# Patient Record
Sex: Female | Born: 1985 | Race: Black or African American | Hispanic: No | Marital: Single | Smoking: Never smoker
Health system: Southern US, Community
[De-identification: ages and names within clinical notes are randomized; demographics above are authoritative.]

## PROBLEM LIST (undated history)

## (undated) DIAGNOSIS — J45909 Unspecified asthma, uncomplicated: Secondary | ICD-10-CM

## (undated) DIAGNOSIS — G43909 Migraine, unspecified, not intractable, without status migrainosus: Secondary | ICD-10-CM

---

## 2009-09-13 ENCOUNTER — Emergency Department (HOSPITAL_COMMUNITY): Admission: EM | Admit: 2009-09-13 | Discharge: 2009-09-14 | Payer: Self-pay | Admitting: Emergency Medicine

## 2010-04-09 ENCOUNTER — Emergency Department (HOSPITAL_COMMUNITY): Admission: EM | Admit: 2010-04-09 | Discharge: 2010-04-09 | Payer: Self-pay | Admitting: Emergency Medicine

## 2010-10-05 ENCOUNTER — Emergency Department (HOSPITAL_COMMUNITY): Payer: Self-pay

## 2010-10-05 ENCOUNTER — Emergency Department (HOSPITAL_COMMUNITY)
Admission: EM | Admit: 2010-10-05 | Discharge: 2010-10-06 | Disposition: A | Discharge status: Home / Self Care | Payer: Self-pay | Attending: Emergency Medicine | Admitting: Emergency Medicine

## 2010-10-05 DIAGNOSIS — K5900 Constipation, unspecified: Secondary | ICD-10-CM | POA: Insufficient documentation

## 2010-10-05 DIAGNOSIS — R112 Nausea with vomiting, unspecified: Secondary | ICD-10-CM | POA: Insufficient documentation

## 2013-01-04 ENCOUNTER — Emergency Department (HOSPITAL_COMMUNITY)
Admission: EM | Admit: 2013-01-04 | Discharge: 2013-01-05 | Disposition: A | Discharge status: Home / Self Care | Payer: Self-pay | Attending: Emergency Medicine | Admitting: Emergency Medicine

## 2013-01-04 ENCOUNTER — Encounter (HOSPITAL_COMMUNITY): Payer: Self-pay | Admitting: Cardiology

## 2013-01-04 ENCOUNTER — Emergency Department (HOSPITAL_COMMUNITY): Payer: Self-pay

## 2013-01-04 DIAGNOSIS — R110 Nausea: Secondary | ICD-10-CM | POA: Insufficient documentation

## 2013-01-04 DIAGNOSIS — F419 Anxiety disorder, unspecified: Secondary | ICD-10-CM

## 2013-01-04 DIAGNOSIS — H53149 Visual discomfort, unspecified: Secondary | ICD-10-CM | POA: Insufficient documentation

## 2013-01-04 DIAGNOSIS — R200 Anesthesia of skin: Secondary | ICD-10-CM

## 2013-01-04 DIAGNOSIS — R42 Dizziness and giddiness: Secondary | ICD-10-CM | POA: Insufficient documentation

## 2013-01-04 DIAGNOSIS — Z79899 Other long term (current) drug therapy: Secondary | ICD-10-CM | POA: Insufficient documentation

## 2013-01-04 DIAGNOSIS — R51 Headache: Secondary | ICD-10-CM | POA: Insufficient documentation

## 2013-01-04 DIAGNOSIS — J45909 Unspecified asthma, uncomplicated: Secondary | ICD-10-CM | POA: Insufficient documentation

## 2013-01-04 DIAGNOSIS — R5381 Other malaise: Secondary | ICD-10-CM | POA: Insufficient documentation

## 2013-01-04 DIAGNOSIS — Z792 Long term (current) use of antibiotics: Secondary | ICD-10-CM | POA: Insufficient documentation

## 2013-01-04 DIAGNOSIS — R209 Unspecified disturbances of skin sensation: Secondary | ICD-10-CM | POA: Insufficient documentation

## 2013-01-04 HISTORY — DX: Unspecified asthma, uncomplicated: J45.909

## 2013-01-04 MED ORDER — METOCLOPRAMIDE HCL 5 MG/ML IJ SOLN
10.0000 mg | Freq: Once | INTRAMUSCULAR | Status: AC
  Administered 2013-01-04: 10 mg via INTRAVENOUS
  Filled 2013-01-04: qty 2

## 2013-01-04 MED ORDER — ONDANSETRON HCL 4 MG/2ML IJ SOLN
4.0000 mg | Freq: Once | INTRAMUSCULAR | Status: AC
  Administered 2013-01-04: 4 mg via INTRAVENOUS
  Filled 2013-01-04: qty 2

## 2013-01-04 MED ORDER — DIPHENHYDRAMINE HCL 50 MG/ML IJ SOLN
25.0000 mg | Freq: Once | INTRAMUSCULAR | Status: AC
  Administered 2013-01-04: 25 mg via INTRAVENOUS
  Filled 2013-01-04: qty 1

## 2013-01-04 MED ORDER — DEXAMETHASONE SODIUM PHOSPHATE 10 MG/ML IJ SOLN
10.0000 mg | Freq: Once | INTRAMUSCULAR | Status: AC
  Administered 2013-01-04: 10 mg via INTRAVENOUS
  Filled 2013-01-04: qty 1

## 2013-01-04 MED ORDER — SODIUM CHLORIDE 0.9 % IV BOLUS (SEPSIS)
1000.0000 mL | Freq: Once | INTRAVENOUS | Status: AC
  Administered 2013-01-04: 1000 mL via INTRAVENOUS

## 2013-01-04 MED ORDER — KETOROLAC TROMETHAMINE 30 MG/ML IJ SOLN
30.0000 mg | Freq: Once | INTRAMUSCULAR | Status: AC
  Administered 2013-01-04: 30 mg via INTRAVENOUS
  Filled 2013-01-04: qty 1

## 2013-01-04 NOTE — ED Provider Notes (Signed)
CSN: [id number]     Arrival date & time 01/04/13  1636 History     First MD Initiated Contact with Patient 01/04/13 2107     Chief Complaint  Patient presents with  . Headache  . Dizziness  . Numbness   (Consider location/radiation/quality/duration/timing/severity/associated sxs/prior Treatment) Patient is a 27 y.o. female presenting with headaches. The history is provided by the patient and a relative.  Headache Pain location:  Frontal Quality:  Dull Severity currently:  8/10 Severity at highest:  8/10 Onset quality:  Gradual Duration:  1 week Timing:  Intermittent Progression:  Unchanged Chronicity:  Recurrent Similar to prior headaches: yes   Relieved by:  Nothing Worsened by:  Light and sound Ineffective treatments:  NSAIDs Associated symptoms: nausea, numbness and photophobia   Associated symptoms: no abdominal pain, no back pain, no congestion, no cough, no diarrhea, no dizziness, no ear pain, no fatigue, no fever, no neck pain, no neck stiffness, no seizures, no sore throat and no vomiting     Past Medical History  Diagnosis Date  . Asthma    History reviewed. No pertinent past surgical history. History reviewed. No pertinent family history. History  Substance Use Topics  . Smoking status: Never Smoker   . Smokeless tobacco: Not on file  . Alcohol Use: Yes   OB History   Grav Para Term Preterm Abortions TAB SAB Ect Mult Living                 Review of Systems  Constitutional: Negative for fever, chills, diaphoresis and fatigue.  HENT: Negative for ear pain, congestion, sore throat, facial swelling, mouth sores, trouble swallowing, neck pain and neck stiffness.   Eyes: Positive for photophobia.  Respiratory: Negative for apnea, cough, chest tightness, shortness of breath and wheezing.   Cardiovascular: Negative for chest pain, palpitations and leg swelling.  Gastrointestinal: Positive for nausea. Negative for vomiting, abdominal pain, diarrhea and  abdominal distention.  Genitourinary: Negative for hematuria, flank pain, vaginal discharge, difficulty urinating and menstrual problem.  Musculoskeletal: Negative for back pain and gait problem.  Skin: Negative for rash and wound.  Neurological: Positive for speech difficulty, weakness, numbness and headaches. Negative for dizziness, tremors, seizures, syncope and facial asymmetry.  Psychiatric/Behavioral: Negative.   All other systems reviewed and are negative.    Allergies  Gluten meal  Home Medications   Current Outpatient Rx  Name  Route  Sig  Dispense  Refill  . albuterol (PROVENTIL HFA;VENTOLIN HFA) 108 (90 BASE) MCG/ACT inhaler   Inhalation   Inhale 2 puffs into the lungs every 6 (six) hours as needed for wheezing.         Marland Kitchen albuterol (PROVENTIL) (2.5 MG/3ML) 0.083% nebulizer solution   Nebulization   Take 2.5 mg by nebulization at bedtime as needed for wheezing.         Marland Kitchen ECHINACEA EXTRACT PO   Oral   Take 2 drops by mouth 3 (three) times daily. For immune support         . ECHINACEA PO   Oral   Take 1 tablet by mouth at bedtime. For immune support         . ibuprofen (ADVIL,MOTRIN) 800 MG tablet   Oral   Take 800 mg by mouth every 8 (eight) hours as needed for pain.         . Magnesium Hydroxide (MAGNESIA PO)   Oral   Take 1 tablet by mouth daily.         Marland Kitchen  Multiple Vitamin (MULTIVITAMIN WITH MINERALS) TABS tablet   Oral   Take 1 tablet by mouth daily.         . traMADol (ULTRAM) 50 MG tablet   Oral   Take 50 mg by mouth every 6 (six) hours as needed for pain.         Marland Kitchen azithromycin (ZITHROMAX) 250 MG tablet   Oral   Take 250-500 mg by mouth daily.          BP 128/78  Pulse 87  Temp(Src) 98 F (36.7 C) (Oral)  Resp 18  SpO2 100%  LMP 12/28/2012 Physical Exam  Nursing note and vitals reviewed. Constitutional: She is oriented to person, place, and time. She appears well-developed and well-nourished. No distress.  HENT:  Head:  Normocephalic and atraumatic.  Right Ear: External ear normal.  Left Ear: External ear normal.  Nose: Nose normal.  Mouth/Throat: Oropharynx is clear and moist. No oropharyngeal exudate.  Eyes: Conjunctivae and EOM are normal. Pupils are equal, round, and reactive to light. Right eye exhibits no discharge. Left eye exhibits no discharge.  Neck: Normal range of motion. Neck supple. No JVD present. No tracheal deviation present. No thyromegaly present.  Cardiovascular: Normal rate, regular rhythm, normal heart sounds and intact distal pulses.  Exam reveals no gallop and no friction rub.   No murmur heard. Pulmonary/Chest: Effort normal and breath sounds normal. No respiratory distress. She has no wheezes. She has no rales. She exhibits no tenderness.  Abdominal: Soft. Bowel sounds are normal. She exhibits no distension. There is no tenderness. There is no rebound and no guarding.  Musculoskeletal: Normal range of motion.  Lymphadenopathy:    She has no cervical adenopathy.  Neurological: She is alert and oriented to person, place, and time. No cranial nerve deficit. Coordination normal. GCS eye subscore is 4. GCS verbal subscore is 5. GCS motor subscore is 6.  Patient states that she cannot feel anything on the right side of her body including her face. Also patient says she is weak in her right arm. When i hold arm above face it does not drop and hit her. Also when I move her arm she resists me with full force. When i apply painful stimulus she withdrawals in her arm and leg  Skin: Skin is warm. No rash noted. She is not diaphoretic.  Psychiatric: She has a normal mood and affect. Her behavior is normal. Judgment and thought content normal. Her speech is delayed.    ED Course   Procedures (including critical care time)  Labs Reviewed - No data to display Ct Head Wo Contrast  01/04/2013   *RADIOLOGY REPORT*  Clinical Data: Headache, dizziness, numbness  CT HEAD WITHOUT CONTRAST  Technique:   Contiguous axial images were obtained from the base of the skull through the vertex without contrast.  Comparison:  None available  Findings: There is no acute intracranial hemorrhage or infarct.  No midline shift or mass lesion.  No extra-axial fluid collection. CSF containing spaces are normal.  Calvarium is intact.  Minimal opacity is present within the right maxillary sinus.  Otherwise, paranasal sinuses and mastoid air cells are clear.  IMPRESSION:  Normal head CT.  No acute intracranial process.   Original Report Authenticated By: Rise Mu, M.D.   1. Numbness   2. Headache   3. Anxiety     MDM  27 yr old F pt with no PMH here with HA and complaints of weakness and numbness. When I walked  into the room patient seemed to talk very slowly but was no aphasic and could repeat. Family says this just started as I walked into the room. Patient says she has had a HA for the past week which is worse with light and sound. She has been under a lot of stress lately and says these kinds of things happen when she feels more stress and her family corroborates that out of the room. Patient with inconsistent neuro exam detailed above. She says she cannot feel anything on the right side of her body but withdrawals to painful stimulus. Patient will also keep her arm from hitting her face and will resist motion in her arm. i believe she has a migraine. Will Ct to screen for any acute abnormality and reassess after headache is better controlled.   Patient with normal CT. She has been ambulatory in the ED. The numbness does not seem to be from a central process with inconsistent exam and feeling on exam when elicited with more painful stimuli. Will give info for outpatient neurology if symptoms persist.  Case discussed with Dr. Elvin So, MD 01/05/13 [phone]

## 2013-01-04 NOTE — ED Notes (Signed)
Friend Contact Information  Algie Coffer: [phone]

## 2013-01-04 NOTE — ED Notes (Signed)
Pt reports that for the past 4 days she has had a headache, numbness in her right arm and dizziness. States that she has also had dry mouth. Denies any vision changes. No neuro deficits noted.

## 2013-01-04 NOTE — ED Notes (Signed)
Pt in route in CT at this time.

## 2013-01-04 NOTE — ED Notes (Signed)
Pt reports migraine headache. Resident Lew Dawes made aware of neuro changes.

## 2013-01-05 NOTE — ED Notes (Signed)
Pt ambulated without assistance

## 2013-01-08 ENCOUNTER — Encounter (HOSPITAL_COMMUNITY): Payer: Self-pay | Admitting: Emergency Medicine

## 2013-01-08 ENCOUNTER — Emergency Department (HOSPITAL_COMMUNITY)
Admission: EM | Admit: 2013-01-08 | Discharge: 2013-01-08 | Disposition: A | Discharge status: Home / Self Care | Payer: Self-pay | Attending: Emergency Medicine | Admitting: Emergency Medicine

## 2013-01-08 DIAGNOSIS — J45909 Unspecified asthma, uncomplicated: Secondary | ICD-10-CM | POA: Insufficient documentation

## 2013-01-08 DIAGNOSIS — Q674 Other congenital deformities of skull, face and jaw: Secondary | ICD-10-CM | POA: Insufficient documentation

## 2013-01-08 DIAGNOSIS — R51 Headache: Secondary | ICD-10-CM | POA: Insufficient documentation

## 2013-01-08 DIAGNOSIS — Z792 Long term (current) use of antibiotics: Secondary | ICD-10-CM | POA: Insufficient documentation

## 2013-01-08 DIAGNOSIS — R300 Dysuria: Secondary | ICD-10-CM | POA: Insufficient documentation

## 2013-01-08 DIAGNOSIS — Z79899 Other long term (current) drug therapy: Secondary | ICD-10-CM | POA: Insufficient documentation

## 2013-01-08 DIAGNOSIS — R200 Anesthesia of skin: Secondary | ICD-10-CM

## 2013-01-08 DIAGNOSIS — Z3202 Encounter for pregnancy test, result negative: Secondary | ICD-10-CM | POA: Insufficient documentation

## 2013-01-08 DIAGNOSIS — R519 Headache, unspecified: Secondary | ICD-10-CM

## 2013-01-08 DIAGNOSIS — R4789 Other speech disturbances: Secondary | ICD-10-CM | POA: Insufficient documentation

## 2013-01-08 DIAGNOSIS — R209 Unspecified disturbances of skin sensation: Secondary | ICD-10-CM | POA: Insufficient documentation

## 2013-01-08 LAB — CBC WITH DIFFERENTIAL/PLATELET
Basophils Relative: 0 % (ref 0–1)
Eosinophils Relative: 1 % (ref 0–5)
Hemoglobin: 13.3 g/dL (ref 12.0–15.0)
Lymphocytes Relative: 19 % (ref 12–46)
Lymphs Abs: 1.6 10*3/uL (ref 0.7–4.0)
MCV: 88.1 fL (ref 78.0–100.0)
Monocytes Absolute: 0.5 10*3/uL (ref 0.1–1.0)
Monocytes Relative: 7 % (ref 3–12)
Platelets: 314 10*3/uL (ref 150–400)
RBC: 4.54 MIL/uL (ref 3.87–5.11)
RDW: 13 % (ref 11.5–15.5)

## 2013-01-08 LAB — BASIC METABOLIC PANEL
CO2: 29 mEq/L (ref 19–32)
Creatinine, Ser: 0.97 mg/dL (ref 0.50–1.10)
GFR calc Af Amer: >90 mL/min (ref >90)
GFR calc non Af Amer: 81 mL/min — ABNORMAL LOW (ref >90)
Glucose, Bld: 93 mg/dL (ref 70–99)
Potassium: 3.7 mEq/L (ref 3.5–5.1)

## 2013-01-08 LAB — URINALYSIS, ROUTINE W REFLEX MICROSCOPIC
Bilirubin Urine: NEGATIVE
Hgb urine dipstick: NEGATIVE
Ketones, ur: NEGATIVE mg/dL
Specific Gravity, Urine: 1.008 (ref 1.005–1.030)

## 2013-01-08 MED ORDER — BUTALBITAL-APAP-CAFFEINE 50-325-40 MG PO TABS: 1.0000 | ORAL_TABLET | Freq: Four times a day (QID) | ORAL | Status: DC | PRN

## 2013-01-08 MED ORDER — DIPHENHYDRAMINE HCL 25 MG PO CAPS
25.0000 mg | ORAL_CAPSULE | Freq: Once | ORAL | Status: AC
  Administered 2013-01-08: 25 mg via ORAL
  Filled 2013-01-08: qty 1

## 2013-01-08 MED ORDER — SODIUM CHLORIDE 0.9 % IV BOLUS (SEPSIS)
1000.0000 mL | Freq: Once | INTRAVENOUS | Status: AC
  Administered 2013-01-08: 1000 mL via INTRAVENOUS

## 2013-01-08 MED ORDER — PROCHLORPERAZINE MALEATE 5 MG PO TABS
5.0000 mg | ORAL_TABLET | Freq: Once | ORAL | Status: AC
  Administered 2013-01-08: 5 mg via ORAL
  Filled 2013-01-08: qty 1

## 2013-01-08 NOTE — ED Notes (Addendum)
Pt has R side weakness. Pt walked to room from lobby independently with not difficulty.

## 2013-01-08 NOTE — ED Provider Notes (Signed)
CSN: [id number]     Arrival date & time 01/08/13  1019 History     First MD Initiated Contact with Patient 01/08/13 1031     Chief Complaint  Patient presents with  . Headache  . Dysuria   (Consider location/radiation/quality/duration/timing/severity/associated sxs/prior Treatment) HPI  Tami Cruz is a 27 y.o. female complaining of increasing diffuse headache described as throbbing, rated at 8/10, cannot identify any exacerbating or alleviating factors. Patient also states that she has developed slurred speech and she has right-sided upper extremity weakness which as per the patient is new however as per chart review patient had had this when she was evaluated at [facility] 4 days ago. There is a numbness and tingling in the right arm that she states radiates into the lower back. She is also complaining that she cannot urinate or defecate however she states that she urinated to yesterday afternoon last bowel movement was 3 days ago. She had one episode of nonbloody, nonbilious, no coffee ground appearing emesis this a.m. Patient has been taking tramadol and Motrin at home with no relief. Patient denies fever, cervicalgia, chest pain, shortness of breath, abdominal pain   Past Medical History  Diagnosis Date  . Asthma    History reviewed. No pertinent past surgical history. History reviewed. No pertinent family history. History  Substance Use Topics  . Smoking status: Never Smoker   . Smokeless tobacco: Not on file  . Alcohol Use: Yes   OB History   Grav Para Term Preterm Abortions TAB SAB Ect Mult Living                 Review of Systems 10 systems reviewed and found to be negative, except as noted in the HPI   Allergies  Gluten meal  Home Medications   Current Outpatient Rx  Name  Route  Sig  Dispense  Refill  . albuterol (PROVENTIL HFA;VENTOLIN HFA) 108 (90 BASE) MCG/ACT inhaler   Inhalation   Inhale 2 puffs into the lungs every 6 (six) hours as needed for  wheezing.         Marland Kitchen albuterol (PROVENTIL) (2.5 MG/3ML) 0.083% nebulizer solution   Nebulization   Take 2.5 mg by nebulization at bedtime as needed for wheezing.         Marland Kitchen ECHINACEA EXTRACT PO   Oral   Take 2 drops by mouth 2 (two) times daily. For immune support         . ibuprofen (ADVIL,MOTRIN) 800 MG tablet   Oral   Take 800 mg by mouth every 8 (eight) hours as needed for pain.         . Magnesium Hydroxide (MAGNESIA PO)   Oral   Take 1 tablet by mouth daily.         . Multiple Vitamin (MULTIVITAMIN WITH MINERALS) TABS tablet   Oral   Take 1 tablet by mouth daily.         . traMADol (ULTRAM) 50 MG tablet   Oral   Take 50 mg by mouth every 6 (six) hours as needed for pain.         Marland Kitchen azithromycin (ZITHROMAX) 250 MG tablet   Oral   Take 250-500 mg by mouth daily.          BP 140/89  Pulse 85  Temp(Src) 99 F (37.2 C) (Oral)  Resp 16  SpO2 99%  LMP 12/28/2012 Physical Exam  Nursing note and vitals reviewed. Constitutional: She is oriented to person, place,  and time. She appears well-developed and well-nourished. No distress.  HENT:  Head: Normocephalic and atraumatic.  Mouth/Throat: Oropharynx is clear and moist.  Facial asymmetry, no slurred speech. Patient does have a slowed and stuttering speech  Eyes: Conjunctivae and EOM are normal. Pupils are equal, round, and reactive to light.  Neck: Normal range of motion. Neck supple.  Cardiovascular: Normal rate, regular rhythm and intact distal pulses.   Pulmonary/Chest: Effort normal and breath sounds normal. No stridor. No respiratory distress. She has no wheezes. She has no rales. She exhibits no tenderness.  Abdominal: Soft. Bowel sounds are normal. She exhibits no distension and no mass. There is no tenderness. There is no rebound and no guarding.  Musculoskeletal: Normal range of motion.  Neurological: She is alert and oriented to person, place, and time. She displays normal reflexes.  Inconsistent  neurologic exam with right-sided weakness when patient is not distracted. Holding the arm above the face and dropping it she gently lowers it to the side of her face, however when evaluating grip strength right side is significantly weaker than left. However patient is able to ambulate independently without issue.       ED Course   Procedures (including critical care time)  Labs Reviewed  BASIC METABOLIC PANEL - Abnormal; Notable for the following:    GFR calc non Af Amer 81 (*)    All other components within normal limits  URINALYSIS, ROUTINE W REFLEX MICROSCOPIC  PREGNANCY, URINE  CBC WITH DIFFERENTIAL   No results found. 1. Headache   2. Numbness     MDM   Filed Vitals:   01/08/13 1025 01/08/13 1311  BP: 140/89 130/78  Pulse: 85 75  Temp: 99 F (37.2 C)   TempSrc: Oral   Resp: 16 18  SpO2: 99% 98%     Tami Cruz is a 27 y.o. female with headache and complaining of right-sided upper extremity weakness, physical exam does not show consistent weakness to the right upper extremity, patient does not have any focal abnormality there is identified on physical exam with repeat testing. Patient was seen for and evaluated for similar complaints 4 days ago. She says she has an appointment with a neurologist at [facility]. I do not think there are any acute neurologic processes that need interventions at this time. Discussed case with attending who agrees with plan and stability to d/c to home.   Medications  sodium chloride 0.9 % bolus 1,000 mL (0 mLs Intravenous Stopped 01/08/13 1219)  diphenhydrAMINE (BENADRYL) capsule 25 mg (25 mg Oral Given 01/08/13 1319)  prochlorperazine (COMPAZINE) tablet 5 mg (5 mg Oral Given 01/08/13 1319)    Pt is hemodynamically stable, appropriate for, and amenable to discharge at this time. Pt verbalized understanding and agrees with care plan. All questions answered. Outpatient follow-up and specific return precautions discussed.    Discharge  Medication List as of 01/08/2013 12:41 PM    START taking these medications   Details  butalbital-acetaminophen-caffeine (FIORICET) 50-325-40 MG per tablet Take 1 tablet by mouth every 6 (six) hours as needed for headache., Starting 01/08/2013, Until Tue 01/08/14, Print        Note: Portions of this report may have been transcribed using voice recognition software. Every effort was made to ensure accuracy; however, inadvertent computerized transcription errors may be present    Wynetta Emery, PA-C 01/08/13 1546

## 2013-01-08 NOTE — ED Provider Notes (Signed)
Medical screening examination/treatment/procedure(s) were performed by non-physician practitioner and as supervising physician I was immediately available for consultation/collaboration.   Myron Stankovich, MD 01/08/13 2155 

## 2013-01-08 NOTE — ED Notes (Signed)
Pt reports gradual increase in HA since seen at [facility] 4 days ago. Pt states she has developed slurred speech.  Pt states she has to concentrate more to go to the bathroom which is unusual for her. Pt reports numbness and tingling in R arm that radiates to lower back. Pt denies sudden onset of symptoms. Denies head injury.

## 2013-01-08 NOTE — Progress Notes (Signed)
[facility] provided patient with a list of primary care resources. Patient stated that she is pending medicaid.

## 2013-01-08 NOTE — ED Notes (Signed)
Waiting for medications from pharmacy

## 2013-01-09 NOTE — ED Provider Notes (Signed)
I performed a history and physical examination of  Tami Cruz and discussed her management with Dr. Lew Dawes. I agree with the history, physical, assessment, and plan of care, with the following exceptions: None I was present for the following procedures: None  Time Spent in Critical Care of the patient: None  Time spent in discussions with the patient and family: 15 minutes  Arafat Cocuzza  Pt with headaches, no nausea, vomiting, visual complains, seizures, altered mental status, loss of consciousness, no gait instability (per patient and Dr. Lew Dawes). Also c/o weakness and numbness. Speech is slow, but able to articulate everything well, and no dysarthria. Pt has had similar episodes with increased stress in the past, just not to this extent. Today had an argument with the family member. 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 We observed in the ED, and gave her Neuro f/u if she is not to get better. No concerns for brain bleed, aneurysms, infection stroke.   Derwood Kaplan, MD 01/09/13 1640

## 2013-01-10 ENCOUNTER — Telehealth: Payer: Self-pay | Admitting: Neurology

## 2013-01-10 ENCOUNTER — Ambulatory Visit: Payer: Self-pay | Admitting: Emergency Medicine

## 2013-01-10 VITALS — BP 130/78 | HR 83 | Temp 98.0°F | Resp 16 | Ht 64.0 in | Wt 233.0 lb

## 2013-01-10 DIAGNOSIS — R51 Headache: Secondary | ICD-10-CM

## 2013-01-10 DIAGNOSIS — K5900 Constipation, unspecified: Secondary | ICD-10-CM

## 2013-01-10 MED ORDER — BISACODYL 5 MG PO TBEC: 5.0000 mg | DELAYED_RELEASE_TABLET | Freq: Every day | ORAL | Status: DC | PRN

## 2013-01-10 NOTE — Patient Instructions (Addendum)

## 2013-01-10 NOTE — Telephone Encounter (Signed)
Mom has repeatedly called the office for an appt and has been repeatedly instructed to contact the patient's PCP for a work up with them first. I spoke w/ mom today, mom states that her daughter called for an appt on Friday and was told by our office to drink water and call back on Monday. I advised mom that our office does not instruct patients to drink water and call back later, our first encounter with the patient was via telephone on Monday. This is documented on a referral form. The patient called in on Monday as an ER referral. The request was sent back to Dr. Arbutus Leas for review on Monday 01/08/13. Dr. Arbutus Leas reviewed the patient's ER notes and instructed our office staff to have the patient follow up with her PCP. I called Monday and spoke with the patient. The patient was advised to follow up with her PCP and verbalized understanding. The patient's mother called 01/09/13 and spoke with Arman Bogus. Jan was initially unaware that we had already spoken with the patient. Jan spoke w/ mom, advised her that she would check into the status of the referral and call her back. After speaking with the mom, Arman Bogus followed up on the referral and was advised that I spoke with the patient the day prior and advised the patient to follow up with her primary care. Jan tried to call the patient's mother back within 5 minutes but got no answer. She left a VM asking mom to call our office back. Jan also contacted to patient and asked her to let her mother know that we did try to follow back up with her and that we have already spoke with her (the patient) and instructed her to follow up with her PCP. The pt agreed to do this. The patient's mother called again today. Again stating no one has called her back. Our office called the pt 01/08/13 and 01/09/13 and spoke directly with the patient on both dates. We also returned the call to mom on 01/09/13 as requested and got no answer. A VM was left for mom to call. Mom was very angry and  frustrated. The call continued to escalate (with regards to mom's behavior), the call was handed off from Arman Bogus to me to handle. Mom was difficult to communicate with. She is clearly frustrated and angry b/c we will not see her daughter. She insisted that our office was saying the 3 ER visits her daughter had gone to her wrong, the hospitals and providers at these hospitals were incompetent and the testing results were wrong. She repeatedly requested that we confirm that this is what we are telling her. I advised mom that this is NOT what our office is stating at all. We are only stating that our medical director, Dr. Arbutus Leas, has reviewed the ER records available in [facility] and determined the referral does not meet our office criteria for an appt. Dr. Arbutus Leas recommended that the pt see her PCP for an appt and proceed from there. Mom asked what this decision was based on. Again mom was advised that this decision is based on Dr. Don Perking medical opinion as our medical director. This pt does not meet our office criteria for an appt. It may be that the patient needs additional work up or testing before a neurology consult warranted. Mom and I were unable to resolve the issue and mom states she will be writing a letter regarding this issue. I will be glad to speak with the  pt or mom if she has additional questions or concerns. / Sherri S.

## 2013-01-10 NOTE — Progress Notes (Signed)
[facility] [address] [place] [address] [phone]  Date:  01/10/2013   Name:  Tami Cruz   DOB:  11-03-1985   MRN:  [id number]  PCP:  No PCP Per Patient    Chief Complaint: Migraine and Constipation   History of Present Illness:  Tami Cruz is a 27 y.o. very pleasant female patient who presents with the following:  History of three visits to the ER for headaches and discharged each time with medications.  Had a negative CT head.  Here to receive a referral to neurology for further evaluation.  Says has had no improvement in her condition with the medication provided in the ER and says it is now an "8".  No neuro or visual symptoms. No antecedent illness or injury.  No fever or chills.  No improvement with over the counter medications or other home remedies.    There are no active problems to display for this patient.   Past Medical History  Diagnosis Date  . Asthma     No past surgical history on file.  History  Substance Use Topics  . Smoking status: Never Smoker   . Smokeless tobacco: Not on file  . Alcohol Use: Yes    History reviewed. No pertinent family history.  Allergies  Allergen Reactions  . Gluten Meal Nausea Only and Other (See Comments)    GI upset and discomfort    Medication list has been reviewed and updated.  Current Outpatient Prescriptions on File Prior to Visit  Medication Sig Dispense Refill  . albuterol (PROVENTIL HFA;VENTOLIN HFA) 108 (90 BASE) MCG/ACT inhaler Inhale 2 puffs into the lungs every 6 (six) hours as needed for wheezing.      Marland Kitchen albuterol (PROVENTIL) (2.5 MG/3ML) 0.083% nebulizer solution Take 2.5 mg by nebulization at bedtime as needed for wheezing.      Marland Kitchen ECHINACEA EXTRACT PO Take 2 drops by mouth 2 (two) times daily. For immune support      . ibuprofen (ADVIL,MOTRIN) 800 MG tablet Take 800 mg by mouth every 8 (eight) hours as needed for pain.      . Magnesium Hydroxide  (MAGNESIA PO) Take 1 tablet by mouth daily.      . Multiple Vitamin (MULTIVITAMIN WITH MINERALS) TABS tablet Take 1 tablet by mouth daily.      Marland Kitchen azithromycin (ZITHROMAX) 250 MG tablet Take 250-500 mg by mouth daily.      . butalbital-acetaminophen-caffeine (FIORICET) 50-325-40 MG per tablet Take 1 tablet by mouth every 6 (six) hours as needed for headache.  20 tablet  0  . traMADol (ULTRAM) 50 MG tablet Take 50 mg by mouth every 6 (six) hours as needed for pain.       No current facility-administered medications on file prior to visit.    Review of Systems:  As per HPI, otherwise negative.    Physical Examination: Filed Vitals:   01/10/13 1558  BP: 130/78  Pulse: 83  Temp: 98 F (36.7 C)  Resp: 16   Filed Vitals:   01/10/13 1558  Height: 5\' 4"  (1.626 m)  Weight: 233 lb (105.688 kg)   Body mass index is 39.97 kg/(m^2). Ideal Body Weight: Weight in (lb) to have BMI = 25: 145.3  GEN: WDWN, NAD, Non-toxic, A & O x 3. Smiling and laughing and moving around freely. HEENT: Atraumatic, Normocephalic. Neck supple. No masses, No LAD. Ears and Nose: No external deformity. CV: RRR, No M/G/R. No JVD. No  thrill. No extra heart sounds. PULM: CTA B, no wheezes, crackles, rhonchi. No retractions. No resp. distress. No accessory muscle use. ABD: S, NT, ND, +BS. No rebound. No HSM. EXTR: No c/c/e NEURO Normal gait.  PSYCH: Normally interactive. Conversant. Not depressed or anxious appearing.  Calm demeanor.    Assessment and Plan: Headache Neuro consultation.   Signed,  Phillips Odor, MD

## 2013-01-10 NOTE — Progress Notes (Signed)
[facility] [address] [place] [address] [phone]  Date:  01/10/2013   Name:  Tami Cruz   DOB:  1985/10/08   MRN:  [id number]  PCP:  No PCP Per Patient    Chief Complaint: Migraine and Constipation   History of Present Illness:  Tami Cruz is a 27 y.o. very pleasant female patient who presents with the following:    There are no active problems to display for this patient.   Past Medical History  Diagnosis Date  . Asthma     No past surgical history on file.  History  Substance Use Topics  . Smoking status: Never Smoker   . Smokeless tobacco: Not on file  . Alcohol Use: Yes    History reviewed. No pertinent family history.  Allergies  Allergen Reactions  . Gluten Meal Nausea Only and Other (See Comments)    GI upset and discomfort    Medication list has been reviewed and updated.  Current Outpatient Prescriptions on File Prior to Visit  Medication Sig Dispense Refill  . albuterol (PROVENTIL HFA;VENTOLIN HFA) 108 (90 BASE) MCG/ACT inhaler Inhale 2 puffs into the lungs every 6 (six) hours as needed for wheezing.      Marland Kitchen albuterol (PROVENTIL) (2.5 MG/3ML) 0.083% nebulizer solution Take 2.5 mg by nebulization at bedtime as needed for wheezing.      Marland Kitchen ECHINACEA EXTRACT PO Take 2 drops by mouth 2 (two) times daily. For immune support      . ibuprofen (ADVIL,MOTRIN) 800 MG tablet Take 800 mg by mouth every 8 (eight) hours as needed for pain.      . Magnesium Hydroxide (MAGNESIA PO) Take 1 tablet by mouth daily.      . Multiple Vitamin (MULTIVITAMIN WITH MINERALS) TABS tablet Take 1 tablet by mouth daily.      Marland Kitchen azithromycin (ZITHROMAX) 250 MG tablet Take 250-500 mg by mouth daily.      . butalbital-acetaminophen-caffeine (FIORICET) 50-325-40 MG per tablet Take 1 tablet by mouth every 6 (six) hours as needed for headache.  20 tablet  0  . traMADol (ULTRAM) 50 MG tablet Take 50 mg by mouth every 6 (six) hours as  needed for pain.       No current facility-administered medications on file prior to visit.    Review of Systems:    Physical Examination: Filed Vitals:   01/10/13 1558  BP: 130/78  Pulse: 83  Temp: 98 F (36.7 C)  Resp: 16   Filed Vitals:   01/10/13 1558  Height: 5\' 4"  (1.626 m)  Weight: 233 lb (105.688 kg)   Body mass index is 39.97 kg/(m^2). Ideal Body Weight: Weight in (lb) to have BMI = 25: 145.3    Assessment and Plan:  Signed,  Phillips Odor, MD

## 2013-01-20 ENCOUNTER — Emergency Department (HOSPITAL_COMMUNITY)
Admission: EM | Admit: 2013-01-20 | Discharge: 2013-01-20 | Disposition: A | Discharge status: Home / Self Care | Payer: Self-pay | Attending: Emergency Medicine | Admitting: Emergency Medicine

## 2013-01-20 ENCOUNTER — Emergency Department (HOSPITAL_COMMUNITY): Payer: Self-pay

## 2013-01-20 ENCOUNTER — Encounter (HOSPITAL_COMMUNITY): Payer: Self-pay | Admitting: Emergency Medicine

## 2013-01-20 DIAGNOSIS — Z3202 Encounter for pregnancy test, result negative: Secondary | ICD-10-CM | POA: Insufficient documentation

## 2013-01-20 DIAGNOSIS — H538 Other visual disturbances: Secondary | ICD-10-CM | POA: Insufficient documentation

## 2013-01-20 DIAGNOSIS — R34 Anuria and oliguria: Secondary | ICD-10-CM | POA: Insufficient documentation

## 2013-01-20 DIAGNOSIS — R200 Anesthesia of skin: Secondary | ICD-10-CM

## 2013-01-20 DIAGNOSIS — Z79899 Other long term (current) drug therapy: Secondary | ICD-10-CM | POA: Insufficient documentation

## 2013-01-20 DIAGNOSIS — J45909 Unspecified asthma, uncomplicated: Secondary | ICD-10-CM | POA: Insufficient documentation

## 2013-01-20 DIAGNOSIS — R209 Unspecified disturbances of skin sensation: Secondary | ICD-10-CM | POA: Insufficient documentation

## 2013-01-20 DIAGNOSIS — R51 Headache: Secondary | ICD-10-CM | POA: Insufficient documentation

## 2013-01-20 DIAGNOSIS — H532 Diplopia: Secondary | ICD-10-CM | POA: Insufficient documentation

## 2013-01-20 DIAGNOSIS — R112 Nausea with vomiting, unspecified: Secondary | ICD-10-CM | POA: Insufficient documentation

## 2013-01-20 DIAGNOSIS — Z880 Allergy status to penicillin: Secondary | ICD-10-CM | POA: Insufficient documentation

## 2013-01-20 LAB — URINALYSIS, ROUTINE W REFLEX MICROSCOPIC
Bilirubin Urine: NEGATIVE
Nitrite: NEGATIVE
Specific Gravity, Urine: 1.022 (ref 1.005–1.030)
Urobilinogen, UA: 1 mg/dL (ref 0.0–1.0)
pH: 7.5 (ref 5.0–8.0)

## 2013-01-20 LAB — CBC WITH DIFFERENTIAL/PLATELET
Basophils Absolute: 0 10*3/uL (ref 0.0–0.1)
Eosinophils Absolute: 0 10*3/uL (ref 0.0–0.7)
Eosinophils Relative: 0 % (ref 0–5)
MCH: 29.2 pg (ref 26.0–34.0)
MCHC: 33.4 g/dL (ref 30.0–36.0)
MCV: 87.5 fL (ref 78.0–100.0)
Monocytes Absolute: 0.6 10*3/uL (ref 0.1–1.0)
Platelets: 313 10*3/uL (ref 150–400)
RDW: 13 % (ref 11.5–15.5)

## 2013-01-20 LAB — COMPREHENSIVE METABOLIC PANEL
ALT: 15 U/L (ref 0–35)
AST: 14 U/L (ref 0–37)
Calcium: 9.7 mg/dL (ref 8.4–10.5)
Creatinine, Ser: 0.91 mg/dL (ref 0.50–1.10)
GFR calc Af Amer: >90 mL/min (ref >90)
Glucose, Bld: 93 mg/dL (ref 70–99)
Sodium: 138 mEq/L (ref 135–145)
Total Protein: 8.2 g/dL (ref 6.0–8.3)

## 2013-01-20 LAB — PREGNANCY, URINE: Preg Test, Ur: NEGATIVE

## 2013-01-20 MED ORDER — IOHEXOL 350 MG/ML SOLN
100.0000 mL | Freq: Once | INTRAVENOUS | Status: AC | PRN
  Administered 2013-01-20: 100 mL via INTRAVENOUS

## 2013-01-20 MED ORDER — MAGNESIUM SULFATE 40 MG/ML IJ SOLN
2.0000 g | Freq: Once | INTRAMUSCULAR | Status: AC
  Administered 2013-01-20: 2 g via INTRAVENOUS
  Filled 2013-01-20: qty 50

## 2013-01-20 MED ORDER — DEXAMETHASONE SODIUM PHOSPHATE 10 MG/ML IJ SOLN
10.0000 mg | Freq: Once | INTRAMUSCULAR | Status: AC
  Administered 2013-01-20: 10 mg via INTRAVENOUS
  Filled 2013-01-20 (×2): qty 1

## 2013-01-20 MED ORDER — DIPHENHYDRAMINE HCL 50 MG/ML IJ SOLN
25.0000 mg | Freq: Once | INTRAMUSCULAR | Status: AC
  Administered 2013-01-20: 25 mg via INTRAVENOUS
  Filled 2013-01-20: qty 1

## 2013-01-20 MED ORDER — ISOMETHEPTENE-APAP-DICHLORAL 65-325-100 MG PO CAPS: ORAL_CAPSULE | ORAL | Status: DC

## 2013-01-20 MED ORDER — MAGNESIUM SULFATE 50 % IJ SOLN: 2.0000 g | Freq: Once | INTRAMUSCULAR | Status: DC

## 2013-01-20 MED ORDER — TRAMADOL HCL 50 MG PO TABS: 50.0000 mg | ORAL_TABLET | Freq: Four times a day (QID) | ORAL | Status: DC | PRN

## 2013-01-20 MED ORDER — METOCLOPRAMIDE HCL 5 MG/ML IJ SOLN
10.0000 mg | Freq: Once | INTRAMUSCULAR | Status: AC
  Administered 2013-01-20: 10 mg via INTRAVENOUS

## 2013-01-20 MED ORDER — HALOPERIDOL LACTATE 5 MG/ML IJ SOLN
5.0000 mg | Freq: Once | INTRAMUSCULAR | Status: AC
  Administered 2013-01-20: 5 mg via INTRAVENOUS
  Filled 2013-01-20: qty 1

## 2013-01-20 MED ORDER — VALPROATE SODIUM 500 MG/5ML IV SOLN
1000.0000 mg | Freq: Once | INTRAVENOUS | Status: AC
  Administered 2013-01-20: 1000 mg via INTRAVENOUS
  Filled 2013-01-20: qty 10

## 2013-01-20 NOTE — ED Provider Notes (Signed)
Assumed care from Dr Lynelle Doctor in sign out. HA improved. CTa neg for aneurysm or other concerning pathology. Has neurology appointment Friday. Return precautions discussed.   Raeford Razor, MD 01/20/13 [phone]

## 2013-01-20 NOTE — ED Provider Notes (Addendum)
CSN: [id number]     Arrival date & time 01/20/13  1203 History     First MD Initiated Contact with Patient 01/20/13 1219     Chief Complaint  Patient presents with  . Generalized Body Aches  . Numbness  . "Hasn't peed at all in a week"    (Consider location/radiation/quality/duration/timing/severity/associated sxs/prior Treatment) HPI  Patient reports she has been having a constant migraine headache for the past 3 weeks which she's never had before. She states the headache is holocranial. She states it's sharp and throbbing. She is having nausea and vomiting about 3 times a day. She states sometimes her vision is blurred and sometimes she sees double. She states her right arm is tingling and then she gets a sharp pain in her arm will be numb for 2-3 days and then the numbness will resolve. She states she has pain in the back of her neck that goes all the way down her back into her lower back and states her low back pain is worse than the upper. She states she has lost her appetite and has had decreased urinary output. She states she's only dribbling and has not had normal urination in a week and a half. She states however she is drinking fluids constantly. She also states she feels like she is off balance. She denies being under any extra stress although she just started [employer] services school 3 months ago. This is her third ED visit to our facility, she was seen August 7 and August 11 for the same. She had a normal head CT done. She was seen at [facility] on August 12 and had an MRI of her brain done which I have reviewed. 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 They also offered to do a lumbar puncture to further evaluate her headaches however she refused.  Patients sister is in the room and she also gets headaches  PCP none  Past Medical History  Diagnosis Date  . Asthma    No past  surgical history on file. No family history on file. History  Substance Use Topics  . Smoking status: Never Smoker   . Smokeless tobacco: Not on file  . 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    Allergies  Gluten meal and Penicillins  Home Medications   Current Outpatient Rx  Name  Route  Sig  Dispense  Refill  . albuterol (PROVENTIL HFA;VENTOLIN HFA) 108 (90 BASE) MCG/ACT inhaler   Inhalation   Inhale 2 puffs into the lungs every 6 (six) hours as needed for wheezing.         Marland Kitchen albuterol (PROVENTIL) (2.5 MG/3ML) 0.083% nebulizer solution   Nebulization   Take 2.5 mg by nebulization at bedtime as needed for wheezing.         Marland Kitchen azithromycin (ZITHROMAX) 250 MG tablet   Oral   Take 250-500 mg by mouth daily.         . bisacodyl (BISACODYL) 5 MG EC tablet   Oral   Take 1 tablet (5 mg total) by mouth daily as needed for constipation.  30 tablet   0   . butalbital-acetaminophen-caffeine (FIORICET) 50-325-40 MG per tablet   Oral   Take 1 tablet by mouth every 6 (six) hours as needed for headache.   20 tablet   0   . ECHINACEA EXTRACT PO   Oral   Take 2 drops by mouth 2 (two) times daily. For immune support         . ibuprofen (ADVIL,MOTRIN) 800 MG tablet   Oral   Take 800 mg by mouth every 8 (eight) hours as needed for pain.         . Magnesium Hydroxide (MAGNESIA PO)   Oral   Take 1 tablet by mouth daily.         . Multiple Vitamin (MULTIVITAMIN WITH MINERALS) TABS tablet   Oral   Take 1 tablet by mouth daily.         . traMADol (ULTRAM) 50 MG tablet   Oral   Take 50 mg by mouth every 6 (six) hours as needed for pain.          BP 128/89  Pulse 81  Temp(Src) 98.1 F (36.7 C) (Oral)  Resp 16  SpO2 100%  LMP 12/28/2012  Vital signs normal   Physical  Exam  Nursing note and vitals reviewed. Constitutional: She is oriented to person, place, and time. She appears well-developed and well-nourished.  Non-toxic appearance. She does not appear ill. No distress.  HENT:  Head: Normocephalic and atraumatic.  Right Ear: External ear normal.  Left Ear: External ear normal.  Nose: Nose normal. No mucosal edema or rhinorrhea.  Mouth/Throat: Oropharynx is clear and moist and mucous membranes are normal. No dental abscesses or edematous.  Eyes: Conjunctivae and EOM are normal. Pupils are equal, round, and reactive to light.  Neck: Normal range of motion and full passive range of motion without pain. Neck supple.  Cardiovascular: Normal rate, regular rhythm and normal heart sounds.  Exam reveals no gallop and no friction rub.   No murmur heard. Pulmonary/Chest: Effort normal and breath sounds normal. No respiratory distress. She has no wheezes. She has no rhonchi. She has no rales. She exhibits no tenderness and no crepitus.  Abdominal: Soft. Normal appearance and bowel sounds are normal. She exhibits no distension. There is no tenderness. There is no rebound and no guarding.  Musculoskeletal: Normal range of motion. She exhibits no edema and no tenderness.  Moves all extremities well.   Neurological: She is alert and oriented to person, place, and time. She has normal strength. No cranial nerve deficit.  Pt speaking slowly, does not have true dysarthria  Pt does not grip my right hand, left grip normal.  Pt has difficulty doing heel to shin with her right leg, normal on the left.  No pronator drift.   Skin: Skin is warm, dry and intact. No rash noted. No erythema. No pallor.  Psychiatric: Her speech is normal and behavior is normal. Her mood appears not anxious.  Flat affect    ED Course   Medications  valproate (DEPACON) 1,000 mg in dextrose 5 % 50 mL IVPB (not administered)  magnesium sulfate IVPB 2 g 50 mL (2 g Intravenous New Bag/Given  01/20/13 1535)  metoCLOPramide (REGLAN) injection 10 mg (10 mg Intravenous Given 01/20/13 1254)  diphenhydrAMINE (BENADRYL) injection 25 mg (25 mg Intravenous Given 01/20/13 1254)  dexamethasone (DECADRON) injection 10 mg (10 mg Intravenous Given 01/20/13 1254)  iohexol (OMNIPAQUE) 350 MG/ML injection 100 mL (100 mLs Intravenous Contrast Given  01/20/13 1401)     Procedures (including critical care time)  Bladder scan showed 31 cc of urine  Recheck 13:50 still has headache "9". Will add more meds. Given results of her tests and advised she will be discharged to keep her appt with [facility] on the 29th.    Jan 09, 2013 MR BRAIN PRE-AND POSTCONTRAST  INDICATION: 780.79 Other malaise and fatigue, right arm weakness provided.  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 CTA Head may be obtained for confirmation. No cause for symptoms seen.  Above findings and variance from preliminary read was discussed with Dr. Tomasita Morrow at 12:30 pm 01/09/2013  Electronically Reviewed by:  Bonner Puna, MD Electronically Reviewed on:  01/09/2013 1:28 PM  I have reviewed the images and concur with the above findings.  Electronically Signed by:  Lolita Patella, MD Electronically Signed on:  01/09/2013 2:51 PM  Results for orders placed during the hospital encounter of 01/20/13  CBC WITH DIFFERENTIAL      Result Value Range   WBC 8.1  4.0 - 10.5 K/uL   RBC 4.72  3.87 - 5.11 MIL/uL   Hemoglobin 13.8  12.0 - 15.0 g/dL   HCT 11.9  14.7 - 82.9 %   MCV 87.5  78.0 - 100.0 fL   MCH 29.2  26.0 - 34.0 pg   MCHC 33.4  30.0 - 36.0 g/dL   RDW 56.2  13.0 - 86.5 %   Platelets 313  150 - 400 K/uL   Neutrophils Relative % 69  43 - 77 %   Neutro Abs 5.6  1.7 - 7.7 K/uL   Lymphocytes Relative 24  12 - 46 %   Lymphs Abs 1.9  0.7 - 4.0 K/uL   Monocytes Relative 7  3 - 12 %   Monocytes Absolute 0.6  0.1 - 1.0 K/uL   Eosinophils  Relative 0  0 - 5 %   Eosinophils Absolute 0.0  0.0 - 0.7 K/uL   Basophils Relative 0  0 - 1 %   Basophils Absolute 0.0  0.0 - 0.1 K/uL  COMPREHENSIVE METABOLIC PANEL      Result Value Range   Sodium 138  135 - 145 mEq/L   Potassium 3.6  3.5 - 5.1 mEq/L   Chloride 102  96 - 112 mEq/L   CO2 27  19 - 32 mEq/L   Glucose, Bld 93  70 - 99 mg/dL   BUN 6  6 - 23 mg/dL   Creatinine, Ser 7.84  0.50 - 1.10 mg/dL   Calcium 9.7  8.4 - 69.6 mg/dL   Total Protein 8.2  6.0 - 8.3 g/dL   Albumin 4.2  3.5 - 5.2 g/dL   AST 14  0 - 37 U/L   ALT 15  0 - 35 U/L   Alkaline Phosphatase 73  39 - 117 U/L   Total Bilirubin 0.3  0.3 - 1.2 mg/dL   GFR calc non Af Amer 87 (*) >90 mL/min   GFR calc Af Amer >90  >90 mL/min  URINALYSIS, ROUTINE W REFLEX MICROSCOPIC      Result Value Range   Color, Urine YELLOW  YELLOW   APPearance CLEAR  CLEAR   Specific Gravity, Urine 1.022  1.005 - 1.030   pH 7.5  5.0 - 8.0   Glucose, UA NEGATIVE  NEGATIVE mg/dL   Hgb urine dipstick NEGATIVE  NEGATIVE   Bilirubin Urine NEGATIVE  NEGATIVE  Ketones, ur NEGATIVE  NEGATIVE mg/dL   Protein, ur NEGATIVE  NEGATIVE mg/dL   Urobilinogen, UA 1.0  0.0 - 1.0 mg/dL   Nitrite NEGATIVE  NEGATIVE   Leukocytes, UA NEGATIVE  NEGATIVE  PREGNANCY, URINE      Result Value Range   Preg Test, Ur NEGATIVE  NEGATIVE   Laboratory interpretation all normal   Ct Angio Head W/cm &/or Wo Cm  01/20/2013   *RADIOLOGY REPORT*  Clinical Data:  Slurred speech with migraine for 2 weeks.  CT ANGIOGRAPHY HEAD  Technique:  Multidetector CT imaging of the head was performed using the standard protocol during bolus administration of intravenous contrast.  Multiplanar CT image reconstructions including MIPs were obtained to evaluate the vascular anatomy.  Contrast: OMNIPAQUE IOHEXOL 350 MG/ML SOLN  Comparison:  CT head 01/04/2013.  Findings:  There is no evidence for acute infarction, intracranial hemorrhage, mass lesion, hydrocephalus, or extra-axial  fluid. There is no atrophy or white matter disease.  Post infusion, there is no abnormal enhancement brain or meninges.  Normal vascular opacification of the internal carotid arteries, basilar artery, and both vertebral arteries (codominant.  There is no proximal stenosis of the anterior, middle, or posterior cerebral arteries.  There is no visible cerebral or cerebellar branch occlusion.  Major dural venous sinuses are widely patent. No intracranial aneurysm is seen.  The calvarium is intact.  There is no sinus or mastoid disease. No change from priors.   Review of the MIP images confirms the above findings.  IMPRESSION: Negative CTA head.   Original Report Authenticated By: Davonna Belling, M.D.    Results for orders placed during the hospital encounter of 01/08/13  URINALYSIS, ROUTINE W REFLEX MICROSCOPIC      Result Value Range   Color, Urine YELLOW  YELLOW   APPearance CLEAR  CLEAR   Specific Gravity, Urine 1.008  1.005 - 1.030   pH 8.0  5.0 - 8.0   Glucose, UA NEGATIVE  NEGATIVE mg/dL   Hgb urine dipstick NEGATIVE  NEGATIVE   Bilirubin Urine NEGATIVE  NEGATIVE   Ketones, ur NEGATIVE  NEGATIVE mg/dL   Protein, ur NEGATIVE  NEGATIVE mg/dL   Urobilinogen, UA 0.2  0.0 - 1.0 mg/dL   Nitrite NEGATIVE  NEGATIVE   Leukocytes, UA NEGATIVE  NEGATIVE  PREGNANCY, URINE      Result Value Range   Preg Test, Ur NEGATIVE  NEGATIVE  CBC WITH DIFFERENTIAL      Result Value Range   WBC 8.3  4.0 - 10.5 K/uL   RBC 4.54  3.87 - 5.11 MIL/uL   Hemoglobin 13.3  12.0 - 15.0 g/dL   HCT 16.1  09.6 - 04.5 %   MCV 88.1  78.0 - 100.0 fL   MCH 29.3  26.0 - 34.0 pg   MCHC 33.3  30.0 - 36.0 g/dL   RDW 40.9  81.1 - 91.4 %   Platelets 314  150 - 400 K/uL   Neutrophils Relative % 73  43 - 77 %   Neutro Abs 6.1  1.7 - 7.7 K/uL   Lymphocytes Relative 19  12 - 46 %   Lymphs Abs 1.6  0.7 - 4.0 K/uL   Monocytes Relative 7  3 - 12 %   Monocytes Absolute 0.5  0.1 - 1.0 K/uL   Eosinophils Relative 1  0 - 5 %    Eosinophils Absolute 0.1  0.0 - 0.7 K/uL   Basophils Relative 0  0 - 1 %  Basophils Absolute 0.0  0.0 - 0.1 K/uL  BASIC METABOLIC PANEL      Result Value Range   Sodium 138  135 - 145 mEq/L   Potassium 3.7  3.5 - 5.1 mEq/L   Chloride 102  96 - 112 mEq/L   CO2 29  19 - 32 mEq/L   Glucose, Bld 93  70 - 99 mg/dL   BUN 7  6 - 23 mg/dL   Creatinine, Ser 4.09  0.50 - 1.10 mg/dL   Calcium 9.0  8.4 - 81.1 mg/dL   GFR calc non Af Amer 81 (*) >90 mL/min   GFR calc Af Amer >90  >90 mL/min   Ct Head Wo Contrast  01/04/2013   *RADIOLOGY REPORT*  Clinical Data: Headache, dizziness, numbness  CT HEAD WITHOUT CONTRAST  Technique:  Contiguous axial images were obtained from the base of the skull through the vertex without contrast.  Comparison:  None available  Findings: There is no acute intracranial hemorrhage or infarct.  No midline shift or mass lesion.  No extra-axial fluid collection. CSF containing spaces are normal.  Calvarium is intact.  Minimal opacity is present within the right maxillary sinus.  Otherwise, paranasal sinuses and mastoid air cells are clear.  IMPRESSION:  Normal head CT.  No acute intracranial process.   Original Report Authenticated By: Rise Mu, M.D.      1. Headache   2. Numbness and tingling of right arm and leg      New Prescriptions   ISOMETHEPTENE-ACETAMINOPHEN-DICHLORALPHENAZONE (MIDRIN) 65-325-100 MG CAPSULE    Headache dosing: q 4 hours prn, maximum 8 capsules/day. Migraine dosing: q 1 hour prn until relieved, maximum 5 capsules/12 hours.    Plan discharge   Devoria Albe, MD, FACEP    MDM    Ward Givens, MD 01/20/13 9147  Ward Givens, MD 01/20/13 [phone]

## 2013-01-20 NOTE — ED Notes (Signed)
Pt states that she has been having slurred speech, migraine x 2 wks.  Was seen at [facility] and referred to a neurologist but can't see him until next week.  Pt claims that she has not urinated at all for 1 wk.  States she is having body aches.

## 2013-01-26 ENCOUNTER — Encounter: Payer: Self-pay | Admitting: Neurology

## 2013-01-26 ENCOUNTER — Ambulatory Visit (INDEPENDENT_AMBULATORY_CARE_PROVIDER_SITE_OTHER): Payer: Self-pay | Admitting: Neurology

## 2013-01-26 VITALS — BP 125/74 | HR 82 | Ht 65.0 in | Wt 228.0 lb

## 2013-01-26 DIAGNOSIS — R519 Headache, unspecified: Secondary | ICD-10-CM | POA: Insufficient documentation

## 2013-01-26 DIAGNOSIS — R51 Headache: Secondary | ICD-10-CM

## 2013-01-26 MED ORDER — AMITRIPTYLINE HCL 10 MG PO TABS: 10.0000 mg | ORAL_TABLET | Freq: Every day | ORAL | Status: DC

## 2013-01-26 NOTE — Patient Instructions (Addendum)
Overall you are doing fairly well but I do want to suggest a few things today:   Remember to drink plenty of fluid, eat healthy meals and do not skip any meals. Try to eat protein with a every meal and eat a healthy snack such as fruit or nuts in between meals. Try to keep a regular sleep-wake schedule and try to exercise daily, particularly in the form of walking, 20-30 minutes a day, if you can.   The good news is your headache and speech should start getting better day by day. Continue to relax and work on removing any stress from your life.   As far as your medications are concerned, I would like to suggest starting a medication called Elavil. Please taken 10mg  nightly.   Discontinue use of the fioricet as this can worsen headaches in the long term  I would like to see you back as needed. Please call us with any interim questions, concerns, problems, updates or refill requests.   Please also call us for any test results so we can go over those with you on the phone.  My clinical assistant and will answer any of your questions and relay your messages to me and also relay most of my messages to you.   Our phone number is [phone]. We also have an after hours call service for urgent matters and there is a physician on-call for urgent questions. For any emergencies you know to call 911 or go to the nearest emergency room

## 2013-01-26 NOTE — Progress Notes (Signed)
[facility]  Provider:  Dr Hosie Poisson Referring Provider: Phillips Odor, MD Primary Care Physician:  No PCP Per Patient  CC: headache and weakness Went to [facility] back to back with [name] Cone: Gets a numbness/weakness on her right side, pain. Just saw the ER doctor, told to follow up with neurology as an outpatient HPI:  Tami Cruz is a 27 y.o. female here as a referral from Dr. Dareen Piano for headache and weakness  States she developed headaches around 3 weeks ago, which progressed to involving and affecting her speech. She describes a generalized pounding headache has been continuous for 3 weeks. Is positive nausea and vomiting, photo and phonophobia. Around 2 weeks ago started having difficulty with her speech. Notes stuttering of her speech, difficulty getting the words out, note some difficulty word finding. Also developed right-sided weakness and sensory changes. Denies any change in her vision. No prior history of headaches before this event. No dizziness. Does note some difficulty and unsteadiness walking. Notes having trouble sleeping since the headache. Denies any stress anxiety. Notes she did get a new job recently, her first episode stated that her headaches started, has not worked due to headaches. Has been to the [facility] 3 times in the dGE R1 for the headache. Has had a head CT, a head CTA and MRI of the brain all of which were normal per report. Images were available for head CT and CTA and appeared unremarkable upon my review. Patient was given IV medication for her headache at the ER to portes this made her tired. She was discharged with Fioricet and Phenergan which she reports has given her minimal to no benefit. He was instructed to followup with neurology.  Otherwise unremarkable past medical history. Denies any history of head trauma. No family history of migraine headaches.  Review of Systems: Out of a complete 14 system review, the patient  complains of only the following symptoms, and all other reviewed systems are negative. Positive for fevers chills blurred vision shortness of breath feeling cold constipation aching muscles allergies urinary problems headache numbness weakness slurred speech dizziness none of sleep racing thoughts insomnia  History   Social History  . Marital Status: Single    Spouse Name: N/A    Number of Children: 0  . Years of Education: BA   Occupational History  . unemployed.    Social History Main Topics  . Smoking status: Never Smoker   . Smokeless tobacco: Never Used  . Alcohol Use: Yes     Comment: socially  . Drug Use: No  . Sexual Activity: Not on file   Other Topics Concern  . Not on file   Social History Narrative   Patient lives at home with her Godmother.    Patient does not work.    Patient does not have any children.    Patient has her BA.           History reviewed. No pertinent family history.  Past Medical History  Diagnosis Date  . Asthma     History reviewed. No pertinent past surgical history.  Current Outpatient Prescriptions  Medication Sig Dispense Refill  . albuterol (PROVENTIL HFA;VENTOLIN HFA) 108 (90 BASE) MCG/ACT inhaler Inhale 2 puffs into the lungs every 6 (six) hours as needed for wheezing.      Marland Kitchen albuterol (PROVENTIL) (2.5 MG/3ML) 0.083% nebulizer solution Take 2.5 mg by nebulization at bedtime as needed for wheezing.      . bisacodyl (BISACODYL) 5  MG EC tablet Take 1 tablet (5 mg total) by mouth daily as needed for constipation.  30 tablet  0  . butalbital-acetaminophen-caffeine (FIORICET) 50-325-40 MG per tablet Take 1 tablet by mouth every 6 (six) hours as needed for headache.  20 tablet  0  . ECHINACEA EXTRACT PO Take 2 drops by mouth 2 (two) times daily. For immune support      . isometheptene-acetaminophen-dichloralphenazone (MIDRIN) 65-325-100 MG capsule Headache dosing: q 4 hours prn, maximum 8 capsules/day. Migraine dosing: q 1 hour prn  until relieved, maximum 5 capsules/12 hours.  30 capsule  0  . Magnesium Hydroxide (MAGNESIA PO) Take 1 tablet by mouth daily.      . magnesium oxide (MAG-OX) 400 MG tablet Take 400 mg by mouth 2 (two) times daily.      . magnesium oxide (MAG-OX) 400 MG tablet Take by mouth. Take 1 tablet (400 mg total) by mouth 2 (two) times daily.      . Multiple Vitamin (MULTIVITAMIN WITH MINERALS) TABS tablet Take 1 tablet by mouth daily.      . traMADol (ULTRAM) 50 MG tablet Take 50 mg by mouth every 6 (six) hours as needed for pain.      . traMADol (ULTRAM) 50 MG tablet Take 1 tablet (50 mg total) by mouth every 6 (six) hours as needed for pain.  15 tablet  0   No current facility-administered medications for this visit.    Allergies as of 01/26/2013 - Review Complete 01/26/2013  Allergen Reaction Noted  . Gluten meal Nausea Only and Other (See Comments) 01/04/2013  . Penicillins Rash 01/04/2013    Vitals: BP 125/74  Pulse 82  Ht 5\' 5"  (1.651 m)  Wt 228 lb (103.42 kg)  BMI 37.94 kg/m2  LMP 12/28/2012 Last Weight:  Wt Readings from Last 1 Encounters:  01/26/13 228 lb (103.42 kg)   Last Height:   Ht Readings from Last 1 Encounters:  01/26/13 5\' 5"  (1.651 m)     Physical exam: Exam: Gen: NAD, conversant Eyes: anicteric sclerae, moist conjunctivae HENT: Atraumatic Neck: Trachea midline; supple,  Lungs: CTA, no wheezing, rales, rhonic                          CV: RRR, no MRG Abdomen: Soft, non-tender;  Extremities: No peripheral edema  Skin: Normal temperature, no rash,  Psych: Appropriate affect, pleasant  Neuro: Tami: AA&Ox3, appropriately interactive, blunted affect   Speech: no dysarthria, halting stuttering speech that improves with distraction  Memory: good recent and remote recall  CN: PERRL, EOMI no nystagmus, VFF to FC bilat, fundus wnl bilat, no ptosis,splitting of midline in V1-3 to LT with vibration lateralizing to the L, face symmetric, no weakness, hearing grossly  intact, palate elevates symmetrically, shoulder shrug 5/5 bilat,  tongue protrudes midline, no fasiculations noted.  Motor: normal bulk and tone Strength: 5/5  In all extremities, giveway weakness on R side but with distraction noted 5/5 strength  Reflexes: symmetrical, bilat downgoing toes  Sens: notes decreased LT and PP on R side in no dermatomal distribution  Gait: slightly wide based gait, unsteady but corrects self, scissors with tandem walking, wobbles but able to right herself without assistance, negative Rhomberg   Assessment:  After physical and neurologic examination, review of laboratory studies, imaging, neurophysiology testing and pre-existing records, assessment will be reviewed on the problem list.  Plan:  Treatment plan and additional workup will be reviewed under Problem List.  Tami Cruz is a 27 year old African American woman presenting for initial evaluation of 3 weeks of headache, speech difficulties and right-sided weakness and sensory changes. She's been about a multiple ERs with both head CT and MRI done which both were read as normal. The symptoms have been continuous and have not responded to both IV and oral medications. Her physical exam has a strong functional overlay to her, with this tractable speech impediment, distractible right-sided weakness and questionable gait instability. Based on her clinical history and description of her headache there may be a migraine without aura underline her symptoms but I feel the majority of her symptoms are functional in nature. I offered the patient a medication which may help of her headache and her mood and she stated she wished to start this medication. I counseled her on discontinuing Fioricet. We talked extensively about how I strongly feel that her symptoms will resolve with time and she will make a full recovery.We discussed the option of a lumbar puncture but I do not feel it is needed at this time. I encouraged her to find  ways to relieve her stress and anxiety and try to relax. I counseled her that this can be a long process and she is to be patient but I am confident she will make a full recovery. She expressed understanding and optimism.  1)Headache 2)Speech difficulties: appear functional 3)Gait instability: appear functional 4)R sided weakness/sensory loss: appears functional in nature  -discontinue Fioricet -start Elavil 10mg  nightly -can consider psychiatry follow up in the future if needed -would consider speech therapy if no improvement in speech

## 2013-04-05 ENCOUNTER — Other Ambulatory Visit: Payer: Self-pay

## 2013-10-10 ENCOUNTER — Emergency Department (HOSPITAL_COMMUNITY)
Admission: EM | Admit: 2013-10-10 | Discharge: 2013-10-10 | Disposition: A | Discharge status: Home / Self Care | Payer: 59 | Attending: Emergency Medicine | Admitting: Emergency Medicine

## 2013-10-10 ENCOUNTER — Encounter (HOSPITAL_COMMUNITY): Payer: Self-pay | Admitting: Emergency Medicine

## 2013-10-10 DIAGNOSIS — J45901 Unspecified asthma with (acute) exacerbation: Secondary | ICD-10-CM

## 2013-10-10 DIAGNOSIS — Z880 Allergy status to penicillin: Secondary | ICD-10-CM | POA: Insufficient documentation

## 2013-10-10 DIAGNOSIS — G43909 Migraine, unspecified, not intractable, without status migrainosus: Secondary | ICD-10-CM | POA: Insufficient documentation

## 2013-10-10 DIAGNOSIS — Z79899 Other long term (current) drug therapy: Secondary | ICD-10-CM | POA: Insufficient documentation

## 2013-10-10 MED ORDER — ALBUTEROL SULFATE (2.5 MG/3ML) 0.083% IN NEBU
5.0000 mg | INHALATION_SOLUTION | Freq: Once | RESPIRATORY_TRACT | Status: AC
  Administered 2013-10-10: 5 mg via RESPIRATORY_TRACT
  Filled 2013-10-10: qty 6

## 2013-10-10 MED ORDER — PREDNISONE 20 MG PO TABS: ORAL_TABLET | ORAL | Status: DC

## 2013-10-10 MED ORDER — DIPHENHYDRAMINE HCL 50 MG/ML IJ SOLN
25.0000 mg | Freq: Once | INTRAMUSCULAR | Status: AC
  Administered 2013-10-10: 25 mg via INTRAMUSCULAR
  Filled 2013-10-10: qty 1

## 2013-10-10 MED ORDER — METOCLOPRAMIDE HCL 5 MG/ML IJ SOLN
10.0000 mg | Freq: Once | INTRAMUSCULAR | Status: AC
  Administered 2013-10-10: 10 mg via INTRAMUSCULAR
  Filled 2013-10-10: qty 2

## 2013-10-10 MED ORDER — OXYCODONE-ACETAMINOPHEN 5-325 MG PO TABS
2.0000 | ORAL_TABLET | Freq: Once | ORAL | Status: AC
  Administered 2013-10-10: 2 via ORAL
  Filled 2013-10-10: qty 2

## 2013-10-10 MED ORDER — DEXAMETHASONE SODIUM PHOSPHATE 10 MG/ML IJ SOLN
10.0000 mg | Freq: Once | INTRAMUSCULAR | Status: AC
  Administered 2013-10-10: 10 mg via INTRAMUSCULAR
  Filled 2013-10-10: qty 1

## 2013-10-10 MED ORDER — ALBUTEROL SULFATE HFA 108 (90 BASE) MCG/ACT IN AERS
2.0000 | INHALATION_SPRAY | RESPIRATORY_TRACT | Status: DC | PRN
  Administered 2013-10-10: 2 via RESPIRATORY_TRACT
  Filled 2013-10-10: qty 6.7

## 2013-10-10 NOTE — ED Notes (Addendum)
Pt reports migraine, SOB, and cough for 2 days. Pt report nonproductive cough.   Belfi MD at bedside assessing pt.

## 2013-10-10 NOTE — ED Notes (Signed)
Pt c/o cough and migraine x 2 days.  States that her migraine makes her sensitive to light.  Denies NVD.  Pt attributes her cough to her asthma acting up.

## 2013-10-10 NOTE — Discharge Instructions (Signed)
Asthma, Adult °Asthma is a recurring condition in which the airways tighten and narrow. Asthma can make it difficult to breathe. It can cause coughing, wheezing, and shortness of breath. Asthma episodes (also called asthma attacks) range from minor to life-threatening. Asthma cannot be cured, but medicines and lifestyle changes can help control it. °CAUSES °Asthma is believed to be caused by inherited (genetic) and environmental factors, but its exact cause is unknown. Asthma may be triggered by allergens, lung infections, or irritants in the air. Asthma triggers are different for each person. Common triggers include:  °· Animal dander. °· Dust mites. °· Cockroaches. °· Pollen from trees or grass. °· Mold. °· Smoke. °· Air pollutants such as dust, household cleaners, hair sprays, aerosol sprays, paint fumes, strong chemicals, or strong odors. °· Cold air, weather changes, and winds (which increase molds and pollens in the air). °· Strong emotional expressions such as crying or laughing hard. °· Stress. °· Certain medicines (such as aspirin) or types of drugs (such as beta-blockers). °· Sulfites in foods and drinks. Foods and drinks that may contain sulfites include dried fruit, potato chips, and sparkling grape juice. °· Infections or inflammatory conditions such as the flu, a cold, or an inflammation of the nasal membranes (rhinitis). °· Gastroesophageal reflux disease (GERD). °· Exercise or strenuous activity. °SYMPTOMS °Symptoms may occur immediately after asthma is triggered or many hours later. Symptoms include: °· Wheezing. °· Excessive nighttime or early morning coughing. °· Frequent or severe coughing with a common cold. °· Chest tightness. °· Shortness of breath. °DIAGNOSIS  °The diagnosis of asthma is made by a review of your medical history and a physical exam. Tests may also be performed. These may include: °· Lung function studies. These tests show how much air you breath in and out. °· Allergy  tests. °· Imaging tests such as X-rays. °TREATMENT  °Asthma cannot be cured, but it can usually be controlled. Treatment involves identifying and avoiding your asthma triggers. It also involves medicines. There are 2 classes of medicine used for asthma treatment:  °· Controller medicines. These prevent asthma symptoms from occurring. They are usually taken every day. °· Reliever or rescue medicines. These quickly relieve asthma symptoms. They are used as needed and provide short-term relief. °Your health care provider will help you create an asthma action plan. An asthma action plan is a written plan for managing and treating your asthma attacks. It includes a list of your asthma triggers and how they may be avoided. It also includes information on when medicines should be taken and when their dosage should be changed. An action plan may also involve the use of a device called a peak flow meter. A peak flow meter measures how well the lungs are working. It helps you monitor your condition. °HOME CARE INSTRUCTIONS  °· Take medicine as directed by your health care provider. Speak with your health care provider if you have questions about how or when to take the medicines. °· Use a peak flow meter as directed by your health care provider. Record and keep track of readings. °· Understand and use the action plan to help minimize or stop an asthma attack without needing to seek medical care. °· Control your home environment in the following ways to help prevent asthma attacks: °· Do not smoke. Avoid being exposed to secondhand smoke. °· Change your heating and air conditioning filter regularly. °· Limit your use of fireplaces and wood stoves. °· Get rid of pests (such as roaches and   mice) and their droppings.  Throw away plants if you see mold on them.  Clean your floors and dust regularly. Use unscented cleaning products.  Try to have someone else vacuum for you regularly. Stay out of rooms while they are being  vacuumed and for a short while afterward. If you vacuum, use a dust mask from a hardware store, a double-layered or microfilter vacuum cleaner bag, or a vacuum cleaner with a HEPA filter.  Replace carpet with wood, tile, or vinyl flooring. Carpet can trap dander and dust.  Use allergy-proof pillows, mattress covers, and box spring covers.  Wash bed sheets and blankets every week in hot water and dry them in a dryer.  Use blankets that are made of polyester or cotton.  Clean bathrooms and kitchens with bleach. If possible, have someone repaint the walls in these rooms with mold-resistant paint. Keep out of the rooms that are being cleaned and painted.  Wash hands frequently. SEEK MEDICAL CARE IF:   You have wheezing, shortness of breath, or a cough even if taking medicine to prevent attacks.  The colored mucus you cough up (sputum) is thicker than usual.  Your sputum changes from clear or white to yellow, green, gray, or bloody.  You have any problems that may be related to the medicines you are taking (such as a rash, itching, swelling, or trouble breathing).  You are using a reliever medicine more than 2 3 times per week.  Your peak flow is still at 50 79% of you personal best after following your action plan for 1 hour. SEEK IMMEDIATE MEDICAL CARE IF:   You seem to be getting worse and are unresponsive to treatment during an asthma attack.  You are short of breath even at rest.  You get short of breath when doing very little physical activity.  You have difficulty eating, drinking, or talking due to asthma symptoms.  You develop chest pain.  You develop a fast heartbeat.  You have a bluish color to your lips or fingernails.  You are lightheaded, dizzy, or faint.  Your peak flow is less than 50% of your personal best.  You have a fever or persistent symptoms for more than 2 3 days.  You have a fever and symptoms suddenly get worse. MAKE SURE YOU:   Understand these  instructions.  Will watch your condition.  Will get help right away if you are not doing well or get worse. Document Released: 05/17/2005 Document Revised: 01/17/2013 Document Reviewed: 12/14/2012 [facility] Patient Information 2014 [place], [place].  Migraine Headache A migraine headache is an intense, throbbing pain on one or both sides of your head. A migraine can last for 30 minutes to several hours. CAUSES  The exact cause of a migraine headache is not always known. However, a migraine may be caused when nerves in the brain become irritated and release chemicals that cause inflammation. This causes pain. Certain things may also trigger migraines, such as:  Alcohol.  Smoking.  Stress.  Menstruation.  Aged cheeses.  Foods or drinks that contain nitrates, glutamate, aspartame, or tyramine.  Lack of sleep.  Chocolate.  Caffeine.  Hunger.  Physical exertion.  Fatigue.  Medicines used to treat chest pain (nitroglycerine), birth control pills, estrogen, and some blood pressure medicines. SIGNS AND SYMPTOMS  Pain on one or both sides of your head.  Pulsating or throbbing pain.  Severe pain that prevents daily activities.  Pain that is aggravated by any physical activity.  Nausea, vomiting, or both.  Dizziness.  Pain with exposure to bright lights, loud noises, or activity.  General sensitivity to bright lights, loud noises, or smells. Before you get a migraine, you may get warning signs that a migraine is coming (aura). An aura may include:  Seeing flashing lights.  Seeing bright spots, halos, or zig-zag lines.  Having tunnel vision or blurred vision.  Having feelings of numbness or tingling.  Having trouble talking.  Having muscle weakness. DIAGNOSIS  A migraine headache is often diagnosed based on:  Symptoms.  Physical exam.  A CT scan or MRI of your head. These imaging tests cannot diagnose migraines, but they can help rule out other causes of  headaches. TREATMENT Medicines may be given for pain and nausea. Medicines can also be given to help prevent recurrent migraines.  HOME CARE INSTRUCTIONS  Only take over-the-counter or prescription medicines for pain or discomfort as directed by your health care provider. The use of long-term narcotics is not recommended.  Lie down in a dark, quiet room when you have a migraine.  Keep a journal to find out what may trigger your migraine headaches. For example, write down:  What you eat and drink.  How much sleep you get.  Any change to your diet or medicines.  Limit alcohol consumption.  Quit smoking if you smoke.  Get 7 9 hours of sleep, or as recommended by your health care provider.  Limit stress.  Keep lights dim if bright lights bother you and make your migraines worse. SEEK IMMEDIATE MEDICAL CARE IF:   Your migraine becomes severe.  You have a fever.  You have a stiff neck.  You have vision loss.  You have muscular weakness or loss of muscle control.  You start losing your balance or have trouble walking.  You feel faint or pass out.  You have severe symptoms that are different from your first symptoms. MAKE SURE YOU:   Understand these instructions.  Will watch your condition.  Will get help right away if you are not doing well or get worse. Document Released: 05/17/2005 Document Revised: 03/07/2013 Document Reviewed: 01/22/2013 [facility]Exit[facility] Patient Information 2014 [place]ExitCare, [place].

## 2013-10-10 NOTE — ED Provider Notes (Signed)
CSN: [id number]     Arrival date & time 10/10/13  1150 History   First MD Initiated Contact with Patient 10/10/13 1212     Chief Complaint  Patient presents with  . Migraine  . Cough     (Consider location/radiation/quality/duration/timing/severity/associated sxs/prior Treatment) HPI Comments: Patient presents with exacerbation of her asthma. She states it normally accept this time of year. She presents with a two-day history of worsening dry cough and wheezing. She's had some associated shortness of breath. She denies any fevers or chills. She denies any chest pain. She does present with a migraine as well. She has a bifrontal-type headache. She states is the same pain as her typical migraines. She denies any nausea vomiting or photophobia. She's been using her albuterol inhaler without relief. She also took an Excedrin last night for her migraine without relief.  Patient is a 28 y.o. female presenting with migraines and cough.  Migraine Associated symptoms include headaches and shortness of breath. Pertinent negatives include no chest pain and no abdominal pain.  Cough Associated symptoms: headaches, shortness of breath and wheezing   Associated symptoms: no chest pain, no chills, no diaphoresis, no fever, no rash and no rhinorrhea     Past Medical History  Diagnosis Date  . Asthma    No past surgical history on file. No family history on file. History  Substance Use Topics  . Smoking status: Never Smoker   . Smokeless tobacco: Never Used  . Alcohol Use: Yes     Comment: socially   OB History   Grav Para Term Preterm Abortions TAB SAB Ect Mult Living                 Review of Systems  Constitutional: Negative for fever, chills, diaphoresis and fatigue.  HENT: Negative for congestion, rhinorrhea and sneezing.   Eyes: Negative.   Respiratory: Positive for cough, shortness of breath and wheezing. Negative for chest tightness.   Cardiovascular: Negative for chest pain and  leg swelling.  Gastrointestinal: Negative for nausea, vomiting, abdominal pain, diarrhea and blood in stool.  Genitourinary: Negative for frequency, hematuria, flank pain and difficulty urinating.  Musculoskeletal: Negative for arthralgias and back pain.  Skin: Negative for rash.  Neurological: Positive for headaches. Negative for dizziness, speech difficulty, weakness and numbness.      Allergies  Gluten meal and Penicillins  Home Medications   Prior to Admission medications   Medication Sig Start Date End Date Taking? Authorizing Provider  albuterol (PROVENTIL HFA;VENTOLIN HFA) 108 (90 BASE) MCG/ACT inhaler Inhale 2 puffs into the lungs every 6 (six) hours as needed for wheezing.   Yes Historical Provider, MD  albuterol (PROVENTIL) (2.5 MG/3ML) 0.083% nebulizer solution Take 2.5 mg by nebulization at bedtime as needed for wheezing.   Yes Historical Provider, MD  aspirin-acetaminophen-caffeine (EXCEDRIN MIGRAINE) 408 [id number] MG per tablet Take 2 tablets by mouth every 6 (six) hours as needed for headache.   Yes Historical Provider, MD  ECHINACEA EXTRACT PO Take 2 drops by mouth 2 (two) times daily. For immune support   Yes Historical Provider, MD  Multiple Vitamin (MULTIVITAMIN WITH MINERALS) TABS tablet Take 1 tablet by mouth daily.   Yes Historical Provider, MD   BP 132/85  Pulse 75  Temp(Src) 98.7 F (37.1 C) (Oral)  Resp 18  SpO2 100% Physical Exam  Constitutional: She is oriented to person, place, and time. She appears well-developed and well-nourished.  HENT:  Head: Normocephalic and atraumatic.  Eyes: Pupils are equal, round, and  reactive to light.  Neck: Normal range of motion. Neck supple.  Cardiovascular: Normal rate, regular rhythm and normal heart sounds.   Pulmonary/Chest: Effort normal. No respiratory distress. She has wheezes. She has no rales. She exhibits no tenderness.  No increased work of breathing  Abdominal: Soft. Bowel sounds are normal. There is no  tenderness. There is no rebound and no guarding.  Musculoskeletal: Normal range of motion. She exhibits no edema.  Lymphadenopathy:    She has no cervical adenopathy.  Neurological: She is alert and oriented to person, place, and time. She has normal strength. No cranial nerve deficit or sensory deficit. GCS eye subscore is 4. GCS verbal subscore is 5. GCS motor subscore is 6.  Skin: Skin is warm and dry. No rash noted.  Psychiatric: She has a normal mood and affect.    ED Course  Procedures (including critical care time) Labs Review Labs Reviewed - No data to display  Imaging Review No results found.   EKG Interpretation None      MDM   Final diagnoses:  Migraine  Asthma exacerbation    Patient presents with 2 complaints. Her migraine she says feels typical for her normal migraine pain. She has new vomiting photophobia neck pain fevers or other symptoms that would be more suggestive of meningitis or subarachnoid hemorrhage. She's feeling better in ED after medications. She also presents with an asthma exacerbation. She has no fevers productive cough or chest pain that would be worrisome for underlying infection or a pulmonary embolus. She was given a nebulizer treatment here as well as a dose of steroids and is feeling better after this. She's talking in full sentences with no hypoxia. She was discharged with an albuterol inhaler and a five-day course of prednisone.    Rolan BuccoMelanie Kerby Borner, MD 10/10/13 [id number]

## 2014-10-02 ENCOUNTER — Emergency Department (HOSPITAL_COMMUNITY)
Admission: EM | Admit: 2014-10-02 | Discharge: 2014-10-02 | Disposition: A | Discharge status: Home / Self Care | Payer: 59 | Attending: Emergency Medicine | Admitting: Emergency Medicine

## 2014-10-02 ENCOUNTER — Encounter (HOSPITAL_COMMUNITY): Payer: Self-pay

## 2014-10-02 DIAGNOSIS — Z880 Allergy status to penicillin: Secondary | ICD-10-CM | POA: Insufficient documentation

## 2014-10-02 DIAGNOSIS — G43009 Migraine without aura, not intractable, without status migrainosus: Secondary | ICD-10-CM | POA: Insufficient documentation

## 2014-10-02 DIAGNOSIS — J45909 Unspecified asthma, uncomplicated: Secondary | ICD-10-CM | POA: Insufficient documentation

## 2014-10-02 DIAGNOSIS — Z79899 Other long term (current) drug therapy: Secondary | ICD-10-CM | POA: Insufficient documentation

## 2014-10-02 HISTORY — DX: Migraine, unspecified, not intractable, without status migrainosus: G43.909

## 2014-10-02 MED ORDER — SODIUM CHLORIDE 0.9 % IV BOLUS (SEPSIS)
500.0000 mL | Freq: Once | INTRAVENOUS | Status: AC
  Administered 2014-10-02: 500 mL via INTRAVENOUS

## 2014-10-02 MED ORDER — DIPHENHYDRAMINE HCL 50 MG/ML IJ SOLN
25.0000 mg | Freq: Once | INTRAMUSCULAR | Status: AC
  Administered 2014-10-02: 25 mg via INTRAVENOUS
  Filled 2014-10-02: qty 1

## 2014-10-02 MED ORDER — KETOROLAC TROMETHAMINE 30 MG/ML IJ SOLN
30.0000 mg | Freq: Once | INTRAMUSCULAR | Status: AC
  Administered 2014-10-02: 30 mg via INTRAVENOUS
  Filled 2014-10-02: qty 1

## 2014-10-02 MED ORDER — DEXAMETHASONE SODIUM PHOSPHATE 10 MG/ML IJ SOLN
10.0000 mg | Freq: Once | INTRAMUSCULAR | Status: AC
  Administered 2014-10-02: 10 mg via INTRAVENOUS
  Filled 2014-10-02: qty 1

## 2014-10-02 MED ORDER — METOCLOPRAMIDE HCL 5 MG/ML IJ SOLN
10.0000 mg | Freq: Once | INTRAMUSCULAR | Status: AC
  Administered 2014-10-02: 10 mg via INTRAVENOUS
  Filled 2014-10-02: qty 2

## 2014-10-02 NOTE — ED Provider Notes (Signed)
CSN: [id number]     Arrival date & time 10/02/14  1604 History   First MD Initiated Contact with Patient 10/02/14 1638     Chief Complaint  Patient presents with  . Migraine     (Consider location/radiation/quality/duration/timing/severity/associated sxs/prior Treatment) HPI Comments: 29 year old female complaining of a migraine headache 5 days. States this feels the same as her prior migraines. Last migraine was about one year ago. Headache located throughout her entire head, described as throbbing, 8/10. She tried taking Excedrin Migraine with no relief. Admits to photophobia, phonophobia and nausea. Denies fever, chills, neck pain, neck stiffness, vision change, numbness, tingling, weakness or confusion. [facility], and has not seen them in about a year.  Patient is a 29 y.o. female presenting with migraines. The history is provided by the patient.  Migraine Associated symptoms include headaches.    Past Medical History  Diagnosis Date  . Asthma   . Migraines    History reviewed. No pertinent past surgical history. History reviewed. No pertinent family history. History  Substance Use Topics  . Smoking status: Never Smoker   . Smokeless tobacco: Never Used  . Alcohol Use: Yes     Comment: socially   OB History    No data available     Review of Systems  Eyes: Positive for photophobia.  Neurological: Positive for headaches.  All other systems reviewed and are negative.     Allergies  Gluten meal and Penicillins  Home Medications   Prior to Admission medications   Medication Sig Start Date End Date Taking? Authorizing Provider  aspirin-acetaminophen-caffeine (EXCEDRIN MIGRAINE) [phone] MG per tablet Take 2 tablets by mouth every 6 (six) hours as needed for headache (headache).    Yes Historical Provider, MD  ECHINACEA EXTRACT PO Take 2 drops by mouth 2 (two) times daily. For immune support   Yes Historical Provider, MD   Multiple Vitamin (MULTIVITAMIN WITH MINERALS) TABS tablet Take 1 tablet by mouth daily.   Yes Historical Provider, MD  predniSONE (DELTASONE) 20 MG tablet 3 tabs po day one, then 2 po daily x 4 days Patient not taking: Reported on 10/02/2014 10/10/13   Rolan BuccoMelanie Belfi, MD   BP 127/83 mmHg  Pulse 82  Temp(Src) 99.5 F (37.5 C) (Oral)  Resp 14  SpO2 99%  LMP 09/30/2014 (Exact Date) Physical Exam  Constitutional: She is oriented to person, place, and time. She appears well-developed and well-nourished. No distress.  HENT:  Head: Normocephalic and atraumatic.  Mouth/Throat: Oropharynx is clear and moist.  Eyes: Conjunctivae and EOM are normal. Pupils are equal, round, and reactive to light.  Neck: Normal range of motion. Neck supple. No Brudzinski's sign and no Kernig's sign noted.  No nuchal rigidity.  Cardiovascular: Normal rate, regular rhythm, normal heart sounds and intact distal pulses.   Pulmonary/Chest: Effort normal and breath sounds normal. No respiratory distress.  Abdominal: Soft. Bowel sounds are normal. There is no tenderness.  Musculoskeletal: Normal range of motion. She exhibits no edema.  Neurological: She is alert and oriented to person, place, and time. She has normal strength. No cranial nerve deficit or sensory deficit. Coordination and gait normal. GCS eye subscore is 4. GCS verbal subscore is 5. GCS motor subscore is 6.  Speech fluent, goal oriented. Moves limbs without ataxia. Equal grip strength BL.  Skin: Skin is warm and dry. No rash noted. She is not diaphoretic.  Psychiatric: She has a normal mood and affect. Her behavior is normal.  Nursing note and vitals reviewed.   ED Course  Procedures (including critical care time) Labs Review Labs Reviewed - No data to display  Imaging Review No results found.   EKG Interpretation None      MDM   Final diagnoses:  Migraine without aura and without status migrainosus, not intractable   Nontoxic appearing,  NAD. VSS. No red flags concerning patient's headache. No meningeal signs or focal neurologic deficits. Doubt meningitis, SAH, ICH, CVA. HA slightly improved after Toradol, Reglan and Benadryl, even more so improved after addition of Decadron. Tolerating PO. Stable for discharge. Advised her to follow-up with her neurologist. Return precautions given. Patient states understanding of treatment care plan and is agreeable.   Kathrynn SpeedRobyn M Shirline Kendle, PA-C 10/02/14 1857  Mancel BaleElliott Wentz, MD 10/03/14 [phone]

## 2014-10-02 NOTE — ED Notes (Signed)
Pt c/o migraine x 5 days.  Pain score 8/10.  Sts that this feels like a normal migraine.  Pt reports taking Excedrin migraine w/o relief.  Pt is followed by neurology, but has not attempted to get an appointment.

## 2014-10-02 NOTE — Discharge Instructions (Signed)
Follow up with your neurologist.  Migraine Headache A migraine headache is an intense, throbbing pain on one or both sides of your head. A migraine can last for 30 minutes to several hours. CAUSES  The exact cause of a migraine headache is not always known. However, a migraine may be caused when nerves in the brain become irritated and release chemicals that cause inflammation. This causes pain. Certain things may also trigger migraines, such as:  Alcohol.  Smoking.  Stress.  Menstruation.  Aged cheeses.  Foods or drinks that contain nitrates, glutamate, aspartame, or tyramine.  Lack of sleep.  Chocolate.  Caffeine.  Hunger.  Physical exertion.  Fatigue.  Medicines used to treat chest pain (nitroglycerine), birth control pills, estrogen, and some blood pressure medicines. SIGNS AND SYMPTOMS  Pain on one or both sides of your head.  Pulsating or throbbing pain.  Severe pain that prevents daily activities.  Pain that is aggravated by any physical activity.  Nausea, vomiting, or both.  Dizziness.  Pain with exposure to bright lights, loud noises, or activity.  General sensitivity to bright lights, loud noises, or smells. Before you get a migraine, you may get warning signs that a migraine is coming (aura). An aura may include:  Seeing flashing lights.  Seeing bright spots, halos, or zigzag lines.  Having tunnel vision or blurred vision.  Having feelings of numbness or tingling.  Having trouble talking.  Having muscle weakness. DIAGNOSIS  A migraine headache is often diagnosed based on:  Symptoms.  Physical exam.  A CT scan or MRI of your head. These imaging tests cannot diagnose migraines, but they can help rule out other causes of headaches. TREATMENT Medicines may be given for pain and nausea. Medicines can also be given to help prevent recurrent migraines.  HOME CARE INSTRUCTIONS  Only take over-the-counter or prescription medicines for pain  or discomfort as directed by your health care provider. The use of long-term narcotics is not recommended.  Lie down in a dark, quiet room when you have a migraine.  Keep a journal to find out what may trigger your migraine headaches. For example, write down:  What you eat and drink.  How much sleep you get.  Any change to your diet or medicines.  Limit alcohol consumption.  Quit smoking if you smoke.  Get 7-9 hours of sleep, or as recommended by your health care provider.  Limit stress.  Keep lights dim if bright lights bother you and make your migraines worse. SEEK IMMEDIATE MEDICAL CARE IF:   Your migraine becomes severe.  You have a fever.  You have a stiff neck.  You have vision loss.  You have muscular weakness or loss of muscle control.  You start losing your balance or have trouble walking.  You feel faint or pass out.  You have severe symptoms that are different from your first symptoms. MAKE SURE YOU:   Understand these instructions.  Will watch your condition.  Will get help right away if you are not doing well or get worse. Document Released: 05/17/2005 Document Revised: 10/01/2013 Document Reviewed: 01/22/2013 [facility] Patient Information 2015 [place]. This information is not intended to replace advice given to you by your health care provider. Make sure you discuss any questions you have with your health care provider.

## 2014-10-09 ENCOUNTER — Emergency Department (HOSPITAL_COMMUNITY): Payer: 59

## 2014-10-09 ENCOUNTER — Encounter (HOSPITAL_COMMUNITY): Payer: Self-pay | Admitting: Emergency Medicine

## 2014-10-09 ENCOUNTER — Emergency Department (HOSPITAL_COMMUNITY)
Admission: EM | Admit: 2014-10-09 | Discharge: 2014-10-09 | Disposition: A | Discharge status: Home / Self Care | Payer: 59 | Attending: Emergency Medicine | Admitting: Emergency Medicine

## 2014-10-09 DIAGNOSIS — Y9289 Other specified places as the place of occurrence of the external cause: Secondary | ICD-10-CM | POA: Insufficient documentation

## 2014-10-09 DIAGNOSIS — Y998 Other external cause status: Secondary | ICD-10-CM | POA: Insufficient documentation

## 2014-10-09 DIAGNOSIS — G43909 Migraine, unspecified, not intractable, without status migrainosus: Secondary | ICD-10-CM | POA: Insufficient documentation

## 2014-10-09 DIAGNOSIS — Z880 Allergy status to penicillin: Secondary | ICD-10-CM | POA: Insufficient documentation

## 2014-10-09 DIAGNOSIS — Y9389 Activity, other specified: Secondary | ICD-10-CM | POA: Insufficient documentation

## 2014-10-09 DIAGNOSIS — X58XXXA Exposure to other specified factors, initial encounter: Secondary | ICD-10-CM | POA: Insufficient documentation

## 2014-10-09 DIAGNOSIS — J45909 Unspecified asthma, uncomplicated: Secondary | ICD-10-CM | POA: Insufficient documentation

## 2014-10-09 DIAGNOSIS — S8251XA Displaced fracture of medial malleolus of right tibia, initial encounter for closed fracture: Secondary | ICD-10-CM | POA: Insufficient documentation

## 2014-10-09 MED ORDER — HYDROCODONE-ACETAMINOPHEN 5-325 MG PO TABS: 1.0000 | ORAL_TABLET | Freq: Four times a day (QID) | ORAL | Status: DC | PRN

## 2014-10-09 MED ORDER — IBUPROFEN 800 MG PO TABS
800.0000 mg | ORAL_TABLET | Freq: Once | ORAL | Status: AC
  Administered 2014-10-09: 800 mg via ORAL
  Filled 2014-10-09: qty 1

## 2014-10-09 NOTE — ED Provider Notes (Signed)
CSN: [id number]     Arrival date & time 10/09/14  [id number] History   First MD Initiated Contact with Patient 10/09/14 0901     Chief Complaint  Patient presents with  . Ankle Pain     (Consider location/radiation/quality/duration/timing/severity/associated sxs/prior Treatment) HPI Comments: Patient presents to the emergency department with chief complaint of right apical pain. States that she missed a step last night. States that she rolled her ankle. States pain persisted until this morning. He was worsened with ambulation this morning. She has not taken anything to alleviate her symptoms. She reports mild swelling. She is able to ambulate, however it is painful. The pain does not radiate.  The history is provided by the patient. No language interpreter was used.    Past Medical History  Diagnosis Date  . Asthma   . Migraines    History reviewed. No pertinent past surgical history. No family history on file. History  Substance Use Topics  . Smoking status: Never Smoker   . Smokeless tobacco: Never Used  . Alcohol Use: Yes     Comment: socially   OB History    No data available     Review of Systems  Constitutional: Negative for fever and chills.  Respiratory: Negative for shortness of breath.   Cardiovascular: Negative for chest pain.  Gastrointestinal: Negative for nausea, vomiting, diarrhea and constipation.  Genitourinary: Negative for dysuria.  Musculoskeletal: Positive for arthralgias.      Allergies  Gluten meal and Penicillins  Home Medications   Prior to Admission medications   Medication Sig Start Date End Date Taking? Authorizing Provider  aspirin-acetaminophen-caffeine (EXCEDRIN MIGRAINE) [phone] MG per tablet Take 2 tablets by mouth every 6 (six) hours as needed for headache (headache).     Historical Provider, MD  ECHINACEA EXTRACT PO Take 2 drops by mouth 2 (two) times daily. For immune support    Historical Provider, MD  Multiple Vitamin  (MULTIVITAMIN WITH MINERALS) TABS tablet Take 1 tablet by mouth daily.    Historical Provider, MD  predniSONE (DELTASONE) 20 MG tablet 3 tabs po day one, then 2 po daily x 4 days Patient not taking: Reported on 10/02/2014 10/10/13   Rolan BuccoMelanie Belfi, MD   BP 123/84 mmHg  Pulse 73  Temp(Src) 98.5 F (36.9 C) (Oral)  Resp 16  SpO2 100%  LMP 09/30/2014 (Exact Date) Physical Exam  Constitutional: She is oriented to person, place, and time. She appears well-developed and well-nourished.  HENT:  Head: Normocephalic and atraumatic.  Eyes: Conjunctivae and EOM are normal.  Neck: Normal range of motion.  Cardiovascular: Normal rate.   Pulmonary/Chest: Effort normal.  Abdominal: She exhibits no distension.  Musculoskeletal: Normal range of motion.  Right ankle tender to palpation over the medial malleolus and deltoid ligament, no lateral malleolus tenderness, no other soft tissue tenderness, no obvious swelling, no bony abnormality or deformity  Neurological: She is alert and oriented to person, place, and time.  Skin: Skin is dry.  Psychiatric: She has a normal mood and affect. Her behavior is normal. Judgment and thought content normal.  Nursing note and vitals reviewed.   ED Course  Procedures (including critical care time) Labs Review Labs Reviewed - No data to display  Imaging Review Dg Ankle Complete Right  10/09/2014   CLINICAL DATA:  29 year old female missed a step and fell with twisting injury. Medial pain. Initial encounter.  EXAM: RIGHT ANKLE - COMPLETE 3+ VIEW  COMPARISON:  09/13/2009.  FINDINGS: Bone mineralization is within normal limits.  Mortise joint alignment preserved. No joint effusion identified. Talar dome intact. Calcaneus intact. The distal fibula appears intact. There is a small 5 mm avulsion fracture at the medial malleolus. No other acute osseous abnormality.  IMPRESSION: Small avulsion fracture medial malleolus.   Electronically Signed   By: Odessa FlemingH  Hall M.D.   On:  10/09/2014 10:15     EKG Interpretation None      MDM   Final diagnoses:  Avulsion fracture of medial malleolus, right, closed, initial encounter    Patient with probable ankle sprain. We'll check plain films given medial malleolus tenderness. Anticipate discharge to home with ankle brace, crutches, pain medicine, and orthopedic follow-up.  Plain films remarkable for small avulsion fracture of the medial malleolus. Patient placed in an Aircast, given crutches, and pain medicine. Discharge to home with orthopedic follow-up.    Roxy Horsemanobert Demetrice Amstutz, PA-C 10/09/14 1512  Purvis SheffieldForrest Harrison, MD 10/10/14 [phone]

## 2014-10-09 NOTE — ED Notes (Signed)
Per pt, states she stepped off step and twisted right ankle

## 2014-10-09 NOTE — Discharge Instructions (Signed)
Ankle Fracture  A fracture is a break in a bone. The ankle joint is made up of three bones. These include the lower (distal)sections of your lower leg bones, called the tibia and fibula, along with a bone in your foot, called the talus. Depending on how bad the break is and if more than one ankle joint bone is broken, a cast or splint is used to protect and keep your injured bone from moving while it heals. Sometimes, surgery is required to help the fracture heal properly.   There are two general types of fractures:   Stable fracture. This includes a single fracture line through one bone, with no injury to ankle ligaments. A fracture of the talus that does not have any displacement (movement of the bone on either side of the fracture line) is also stable.   Unstable fracture. This includes more than one fracture line through one or more bones in the ankle joint. It also includes fractures that have displacement of the bone on either side of the fracture line.  CAUSES   A direct blow to the ankle.    Quickly and severely twisting your ankle.   Trauma, such as a car accident or falling from a significant height.  RISK FACTORS  You may be at a higher risk of ankle fracture if:   You have certain medical conditions.   You are involved in high-impact sports.   You are involved in a high-impact car accident.  SIGNS AND SYMPTOMS    Tender and swollen ankle.   Bruising around the injured ankle.   Pain on movement of the ankle.   Difficulty walking or putting weight on the ankle.   A cold foot below the site of the ankle injury. This can occur if the blood vessels passing through your injured ankle were also damaged.   Numbness in the foot below the site of the ankle injury.  DIAGNOSIS   An ankle fracture is usually diagnosed with a physical exam and X-rays. A CT scan may also be required for complex fractures.  TREATMENT   Stable fractures are treated with a cast or splint and using crutches to avoid putting  weight on your injured ankle. This is followed by an ankle strengthening program. Some patients require a special type of cast, depending on other medical problems they may have. Unstable fractures require surgery to ensure the bones heal properly. Your health care provider will tell you what type of fracture you have and the best treatment for your condition.  HOME CARE INSTRUCTIONS    Review correct crutch use with your health care provider and use your crutches as directed. Safe use of crutches is extremely important. Misuse of crutches can cause you to fall or cause injury to nerves in your hands or armpits.   Do not put weight or pressure on the injured ankle until directed by your health care provider.   To lessen the swelling, keep the injured leg elevated while sitting or lying down.   Apply ice to the injured area:   Put ice in a plastic bag.   Place a towel between your cast and the bag.   Leave the ice on for 20 minutes, 2-3 times a day.   If you have a plaster or fiberglass cast:   Do not try to scratch the skin under the cast with any objects. This can increase your risk of skin infection.   Check the skin around the cast every day. You   may put lotion on any red or sore areas.   Keep your cast dry and clean.   If you have a plaster splint:   Wear the splint as directed.   You may loosen the elastic around the splint if your toes become numb, tingle, or turn cold or blue.   Do not put pressure on any part of your cast or splint; it may break. Rest your cast only on a pillow the first 24 hours until it is fully hardened.   Your cast or splint can be protected during bathing with a plastic bag sealed to your skin with medical tape. Do not lower the cast or splint into water.   Take medicines as directed by your health care provider. Only take over-the-counter or prescription medicines for pain, discomfort, or fever as directed by your health care provider.   Do not drive a vehicle until  your health care provider specifically tells you it is safe to do so.   If your health care provider has given you a follow-up appointment, it is very important to keep that appointment. Not keeping the appointment could result in a chronic or permanent injury, pain, and disability. If you have any problem keeping the appointment, call the facility for assistance.  SEEK MEDICAL CARE IF:  You develop increased swelling or discomfort.  SEEK IMMEDIATE MEDICAL CARE IF:    Your cast gets damaged or breaks.   You have continued severe pain.   You develop new pain or swelling after the cast was put on.   Your skin or toenails below the injury turn blue or gray.   Your skin or toenails below the injury feel cold, numb, or have loss of sensitivity to touch.   There is a bad smell or pus draining from under the cast.  MAKE SURE YOU:    Understand these instructions.   Will watch your condition.   Will get help right away if you are not doing well or get worse.  Document Released: 05/14/2000 Document Revised: 05/22/2013 Document Reviewed: 12/14/2012  [employer] Patient Information 2015 [employer]. This information is not intended to replace advice given to you by your health care provider. Make sure you discuss any questions you have with your health care provider.

## 2014-10-28 ENCOUNTER — Emergency Department (HOSPITAL_COMMUNITY)
Admission: EM | Admit: 2014-10-28 | Discharge: 2014-10-28 | Disposition: A | Discharge status: Home / Self Care | Payer: 59 | Attending: Emergency Medicine | Admitting: Emergency Medicine

## 2014-10-28 ENCOUNTER — Encounter (HOSPITAL_COMMUNITY): Payer: Self-pay

## 2014-10-28 DIAGNOSIS — Z79899 Other long term (current) drug therapy: Secondary | ICD-10-CM | POA: Insufficient documentation

## 2014-10-28 DIAGNOSIS — Z8679 Personal history of other diseases of the circulatory system: Secondary | ICD-10-CM | POA: Insufficient documentation

## 2014-10-28 DIAGNOSIS — R110 Nausea: Secondary | ICD-10-CM | POA: Insufficient documentation

## 2014-10-28 DIAGNOSIS — J45909 Unspecified asthma, uncomplicated: Secondary | ICD-10-CM | POA: Insufficient documentation

## 2014-10-28 DIAGNOSIS — F329 Major depressive disorder, single episode, unspecified: Secondary | ICD-10-CM | POA: Insufficient documentation

## 2014-10-28 DIAGNOSIS — F32A Depression, unspecified: Secondary | ICD-10-CM

## 2014-10-28 DIAGNOSIS — R51 Headache: Secondary | ICD-10-CM | POA: Insufficient documentation

## 2014-10-28 DIAGNOSIS — F419 Anxiety disorder, unspecified: Secondary | ICD-10-CM | POA: Insufficient documentation

## 2014-10-28 DIAGNOSIS — Z880 Allergy status to penicillin: Secondary | ICD-10-CM | POA: Insufficient documentation

## 2014-10-28 DIAGNOSIS — Z3202 Encounter for pregnancy test, result negative: Secondary | ICD-10-CM | POA: Insufficient documentation

## 2014-10-28 LAB — CBC WITH DIFFERENTIAL/PLATELET
Basophils Absolute: 0 10*3/uL (ref 0.0–0.1)
Basophils Relative: 0 % (ref 0–1)
EOS PCT: 0 % (ref 0–5)
Eosinophils Absolute: 0 10*3/uL (ref 0.0–0.7)
HCT: 40.9 % (ref 36.0–46.0)
Hemoglobin: 13.4 g/dL (ref 12.0–15.0)
LYMPHS ABS: 2.3 10*3/uL (ref 0.7–4.0)
Lymphocytes Relative: 26 % (ref 12–46)
MCH: 28.9 pg (ref 26.0–34.0)
MCHC: 32.8 g/dL (ref 30.0–36.0)
MCV: 88.1 fL (ref 78.0–100.0)
MONO ABS: 0.8 10*3/uL (ref 0.1–1.0)
Monocytes Relative: 9 % (ref 3–12)
NEUTROS ABS: 5.8 10*3/uL (ref 1.7–7.7)
Neutrophils Relative %: 65 % (ref 43–77)
Platelets: 289 10*3/uL (ref 150–400)
RBC: 4.64 MIL/uL (ref 3.87–5.11)
RDW: 12.8 % (ref 11.5–15.5)
WBC: 9 10*3/uL (ref 4.0–10.5)

## 2014-10-28 LAB — BASIC METABOLIC PANEL
Anion gap: 9 (ref 5–15)
BUN: 7 mg/dL (ref 6–20)
CALCIUM: 9.1 mg/dL (ref 8.9–10.3)
CO2: 23 mmol/L (ref 22–32)
Chloride: 108 mmol/L (ref 101–111)
Creatinine, Ser: 0.99 mg/dL (ref 0.44–1.00)
GFR calc Af Amer: >60 mL/min (ref >60)
GFR calc non Af Amer: >60 mL/min (ref >60)
Glucose, Bld: 84 mg/dL (ref 65–99)
POTASSIUM: 3.7 mmol/L (ref 3.5–5.1)
Sodium: 140 mmol/L (ref 135–145)

## 2014-10-28 LAB — URINALYSIS, ROUTINE W REFLEX MICROSCOPIC
Bilirubin Urine: NEGATIVE
Glucose, UA: NEGATIVE mg/dL
Hgb urine dipstick: NEGATIVE
Ketones, ur: NEGATIVE mg/dL
Leukocytes, UA: NEGATIVE
Nitrite: NEGATIVE
PROTEIN: NEGATIVE mg/dL
Specific Gravity, Urine: 1.007 (ref 1.005–1.030)
Urobilinogen, UA: 0.2 mg/dL (ref 0.0–1.0)
pH: 6 (ref 5.0–8.0)

## 2014-10-28 LAB — PREGNANCY, URINE: Preg Test, Ur: NEGATIVE

## 2014-10-28 LAB — RAPID URINE DRUG SCREEN, HOSP PERFORMED
Amphetamines: NOT DETECTED
BARBITURATES: NOT DETECTED
BENZODIAZEPINES: NOT DETECTED
Cocaine: NOT DETECTED
Opiates: NOT DETECTED
Tetrahydrocannabinol: NOT DETECTED

## 2014-10-28 LAB — ACETAMINOPHEN LEVEL

## 2014-10-28 LAB — ETHANOL

## 2014-10-28 LAB — SALICYLATE LEVEL: Salicylate Lvl: <4 mg/dL (ref 2.8–30.0)

## 2014-10-28 MED ORDER — PROMETHAZINE HCL 25 MG/ML IJ SOLN
12.5000 mg | Freq: Once | INTRAMUSCULAR | Status: AC
  Administered 2014-10-28: 12.5 mg via INTRAVENOUS
  Filled 2014-10-28: qty 1

## 2014-10-28 MED ORDER — KETOROLAC TROMETHAMINE 30 MG/ML IJ SOLN
30.0000 mg | Freq: Once | INTRAMUSCULAR | Status: AC
  Administered 2014-10-28: 30 mg via INTRAVENOUS
  Filled 2014-10-28: qty 1

## 2014-10-28 MED ORDER — SODIUM CHLORIDE 0.9 % IV BOLUS (SEPSIS)
1000.0000 mL | Freq: Once | INTRAVENOUS | Status: AC
  Administered 2014-10-28: 1000 mL via INTRAVENOUS

## 2014-10-28 NOTE — ED Notes (Signed)
Pt c/o migraine x 2 weeks and increasing anxiety, insomnia and depression x 2 weeks.  Pain score 7/10.  Pt report Hx of migraines, but sts that this feels different.  Sts "it feels tight."  Pt reports being under a lot of stress recently, including losing a job and calling off a marriage.  Pt has been taking Excedrin migraine w/o relief.  Denies SI/HI/AV.

## 2014-10-28 NOTE — BH Assessment (Addendum)
Tele Assessment Note   Tami Cruz is an 29 y.o. female.  -Clinician reviewed note by Dr. Fredderick Phenix regarding need for TTS.  Pt is having some vague thoughts of harming herself.  Has had more migraines in the last two weeks.  Pt acknowledges that she has had some thoughts about killing herself.  She denies any current intention or plan however.  Patient has had no prior history of suicide attempts or any self harm.  Patient denies any HI or A/V hallucinations.  Patient has had a lot of stressors over the last month.  Two weeks ago her fiance (of 3 years) broke off their engagement.  They had planned a March 2017 marriage.  Patient has also had her hours reduced at work which has created a financial hardship.  Patient does live with her godmother.  Patient said that her headaches started up more frequently in the past two weeks, which coincides with when fiance broke up with her.  She has been isolating herself from her friends because she feels like "they just tolerate me."  Patient has been having panic attacks almost nightly for last two weeks.  Patient feels like she can contract for safety.  She said that she feels that she would be safe to go home tonight.  Pt is able to follow up on referrals.  She has not had any previous inpatient or outpatient care.  -Clinician discussed patient care with Donell Sievert, PA who recommends contract for safety and outpatient referrals.  Discussed with Dr. Fredderick Phenix who is okay with disposition.  Patient was given referrals and signed no harm contract.   Axis I: Anxiety Disorder NOS and Major Depression, single episode Axis II: Deferred Axis III:  Past Medical History  Diagnosis Date  . Asthma   . Migraines    Axis IV: economic problems, occupational problems, other psychosocial or environmental problems and problems related to social environment Axis V: 41-50 serious symptoms  Past Medical History:  Past Medical History  Diagnosis Date  . Asthma    . Migraines     History reviewed. No pertinent past surgical history.  Family History: History reviewed. No pertinent family history.  Social History:  reports that she has never smoked. She has never used smokeless tobacco. She reports that she drinks alcohol. She reports that she does not use illicit drugs.  Additional Social History:  Alcohol / Drug Use Pain Medications: None Prescriptions: None Over the Counter: Excedrin Migraine History of alcohol / drug use?: No history of alcohol / drug abuse  CIWA: CIWA-Ar BP: 104/66 mmHg Pulse Rate: 78 COWS:    PATIENT STRENGTHS: (choose at least two) Average or above average intelligence Communication skills Supportive family/friends  Allergies:  Allergies  Allergen Reactions  . Gluten Meal Nausea Only and Other (See Comments)    GI upset and discomfort  . Penicillins Rash    Home Medications:  (Not in a hospital admission)  OB/GYN Status:  Patient's last menstrual period was 09/29/2014.  General Assessment Data Location of Assessment: [facility] TTS Assessment: In system Is this a Tele or Face-to-Face Assessment?: Face-to-Face Is this an Initial Assessment or a Re-assessment for this encounter?: Initial Assessment Marital status: Single Maiden name: N/A Is patient pregnant?: No Pregnancy Status: No Living Arrangements: Non-relatives/Friends (Lives with godmother.) Can pt return to current living arrangement?: Yes Admission Status: Voluntary Is patient capable of signing voluntary admission?: Yes Referral Source: Self/Family/Friend Insurance type: Self pay     Crisis Care Plan Living  Arrangements: Non-relatives/Friends (Lives with godmother.) Name of Psychiatrist: None Name of Therapist: None  Education Status Highest grade of school patient has completed: BS in Liberal Studies  Risk to self with the past 6 months Suicidal Ideation: Yes-Currently Present Has patient been a risk to self within the past 6 months  prior to admission? : No Suicidal Intent: No Has patient had any suicidal intent within the past 6 months prior to admission? : No Is patient at risk for suicide?: No Suicidal Plan?: No Has patient had any suicidal plan within the past 6 months prior to admission? : No Access to Means: No What has been your use of drugs/alcohol within the last 12 months?: None Previous Attempts/Gestures: No How many times?: 0 Other Self Harm Risks: None Triggers for Past Attempts: None known Intentional Self Injurious Behavior: None Family Suicide History: No Recent stressful life event(s): Job Loss, Recent negative physical changes, Financial Problems (Worsening headaches in last 2 weeks, reduction in hours, fin) Persecutory voices/beliefs?: Yes Depression: Yes Depression Symptoms: Despondent, Isolating, Tearfulness, Insomnia, Guilt, Loss of interest in usual pleasures, Feeling worthless/self pity Substance abuse history and/or treatment for substance abuse?: No Suicide prevention information given to non-admitted patients: Not applicable  Risk to Others within the past 6 months Homicidal Ideation: No Does patient have any lifetime risk of violence toward others beyond the six months prior to admission? : No Thoughts of Harm to Others: No Current Homicidal Intent: No Current Homicidal Plan: No Access to Homicidal Means: No Identified Victim: No one History of harm to others?: No Assessment of Violence: None Noted Violent Behavior Description: None, pt is calm and cooperative Does patient have access to weapons?: No Criminal Charges Pending?: No Does patient have a court date: No Is patient on probation?: No  Psychosis Hallucinations: None noted Delusions: None noted  Mental Status Report Appearance/Hygiene: Unremarkable Eye Contact: Fair Motor Activity: Freedom of movement, Unremarkable Speech: Logical/coherent, Soft Level of Consciousness: Alert Mood: Depressed, Despair, Helpless,  Sad, Worthless, low self-esteem, Anxious Affect: Blunted, Depressed, Sad Anxiety Level: Panic Attacks Panic attack frequency: Nightly over last couple of weeks. Most recent panic attack: Last night Thought Processes: Coherent, Relevant Judgement: Unimpaired Orientation: Person, Place, Situation, Time Obsessive Compulsive Thoughts/Behaviors: None  Cognitive Functioning Concentration: Decreased Memory: Recent Intact, Remote Intact IQ: Average Insight: Fair Impulse Control: Good Appetite: Poor Weight Loss: 0 Weight Gain: 0 Sleep: Decreased Total Hours of Sleep:  (<4H/D) Vegetative Symptoms: Staying in bed  ADLScreening [facility] Assessment Services) Patient's cognitive ability adequate to safely complete daily activities?: Yes Patient able to express need for assistance with ADLs?: Yes Independently performs ADLs?: Yes (appropriate for developmental age)  Prior Inpatient Therapy Prior Inpatient Therapy: No Prior Therapy Dates: None Prior Therapy Facilty/Provider(s): None Reason for Treatment: None  Prior Outpatient Therapy Prior Outpatient Therapy: No Prior Therapy Dates: N/A Prior Therapy Facilty/Provider(s): N/a Reason for Treatment: N/A Does patient have an ACCT team?: No Does patient have Intensive In-House Services?  : No Does patient have [facility] services? : No Does patient have P4CC services?: No  ADL Screening (condition at time of admission) Patient's cognitive ability adequate to safely complete daily activities?: Yes Is the patient deaf or have difficulty hearing?: No Does the patient have difficulty seeing, even when wearing glasses/contacts?: No Does the patient have difficulty concentrating, remembering, or making decisions?: No Patient able to express need for assistance with ADLs?: Yes Does the patient have difficulty dressing or bathing?: No Independently performs ADLs?: Yes (appropriate for developmental age) Does  the patient have difficulty walking or  climbing stairs?: No Weakness of Legs: None Weakness of Arms/Hands: None       Abuse/Neglect Assessment (Assessment to be complete while patient is alone) Physical Abuse: Denies Verbal Abuse: Denies Sexual Abuse: Denies Exploitation of patient/patient's resources: Denies Self-Neglect: Denies     Merchant navy officer (For Healthcare) Does patient have an advance directive?: No Would patient like information on creating an advanced directive?: No - patient declined information    Additional Information 1:1 In Past 12 Months?: No CIRT Risk: No Elopement Risk: No Does patient have medical clearance?: Yes     Disposition:  Disposition Initial Assessment Completed for this Encounter: Yes Disposition of Patient: Other dispositions Other disposition(s): Referred to outside facility (Consult with PA)  Beatriz Stallion Ray 10/28/2014 8:57 PM

## 2014-10-28 NOTE — ED Notes (Signed)
2 attempts to start an PIV and draw blood without success, will page IV team.

## 2014-10-28 NOTE — ED Provider Notes (Signed)
CSN: [id number]     Arrival date & time 10/28/14  1549 History   First MD Initiated Contact with Patient 10/28/14 1647     Chief Complaint  Patient presents with  . Migraine  . Anxiety  . Depression     (Consider location/radiation/quality/duration/timing/severity/associated sxs/prior Treatment) HPI Comments: Patient presents with anxiety and depression. She states that she recently was laid off and she also broke up with her fianc who called off their upcoming marriage. She states she's been very depressed about this and very anxious. She's been feeling shaky all the time. She says that she's not sleeping well. She does report that she is having thoughts of wanting to hurt herself but she doesn't have this plan of suicide. She denies any past suicide attempts. She denies any alcohol or drug use. She does have a history of migraines and states that she's had a headache for about 3 weeks. She notes that the bifrontal-type throbbing headache. She denies any neck pain. No fevers or chills. No recent head injuries. She's been using Excedrin Migraine every 4-6 hours for about the last week. This is not relieved her headache.  Patient is a 29 y.o. female presenting with migraines and anxiety.  Migraine Associated symptoms include headaches. Pertinent negatives include no chest pain, no abdominal pain and no shortness of breath.  Anxiety Associated symptoms include headaches. Pertinent negatives include no chest pain, no abdominal pain and no shortness of breath.    Past Medical History  Diagnosis Date  . Asthma   . Migraines    History reviewed. No pertinent past surgical history. History reviewed. No pertinent family history. History  Substance Use Topics  . Smoking status: Never Smoker   . Smokeless tobacco: Never Used  . Alcohol Use: Yes     Comment: socially   OB History    No data available     Review of Systems  Constitutional: Positive for fatigue. Negative for fever, chills  and diaphoresis.  HENT: Negative for congestion, rhinorrhea and sneezing.   Eyes: Negative.   Respiratory: Negative for cough, chest tightness and shortness of breath.   Cardiovascular: Negative for chest pain and leg swelling.  Gastrointestinal: Positive for nausea. Negative for vomiting, abdominal pain, diarrhea and blood in stool.  Genitourinary: Negative for frequency, hematuria, flank pain and difficulty urinating.  Musculoskeletal: Negative for back pain and arthralgias.  Skin: Negative for rash.  Neurological: Positive for headaches. Negative for dizziness, speech difficulty, weakness and numbness.  Psychiatric/Behavioral: Positive for suicidal ideas, sleep disturbance and dysphoric mood. The patient is nervous/anxious.       Allergies  Gluten meal and Penicillins  Home Medications   Prior to Admission medications   Medication Sig Start Date End Date Taking? Authorizing Provider  ECHINACEA EXTRACT PO Take 2 drops by mouth 2 (two) times daily. For immune support   Yes Historical Provider, MD  Multiple Vitamin (MULTIVITAMIN WITH MINERALS) TABS tablet Take 1 tablet by mouth daily.   Yes Historical Provider, MD  HYDROcodone-acetaminophen (NORCO/VICODIN) 5-325 MG per tablet Take 1-2 tablets by mouth every 6 (six) hours as needed. Patient not taking: Reported on 10/28/2014 10/09/14   Roxy Horsemanobert Browning, PA-C  predniSONE (DELTASONE) 20 MG tablet 3 tabs po day one, then 2 po daily x 4 days Patient not taking: Reported on 10/02/2014 10/10/13   Rolan BuccoMelanie Shreeya Recendiz, MD   BP 122/69 mmHg  Pulse 74  Temp(Src) 98.5 F (36.9 C) (Oral)  Resp 18  SpO2 100%  LMP 09/29/2014 Physical Exam  Constitutional: She is oriented to person, place, and time. She appears well-developed and well-nourished.  HENT:  Head: Normocephalic and atraumatic.  Eyes: Pupils are equal, round, and reactive to light.  Neck: Normal range of motion. Neck supple.  No meningeal signs  Cardiovascular: Normal rate, regular rhythm  and normal heart sounds.   Pulmonary/Chest: Effort normal and breath sounds normal. No respiratory distress. She has no wheezes. She has no rales. She exhibits no tenderness.  Abdominal: Soft. Bowel sounds are normal. There is no tenderness. There is no rebound and no guarding.  Musculoskeletal: Normal range of motion. She exhibits no edema.  Lymphadenopathy:    She has no cervical adenopathy.  Neurological: She is alert and oriented to person, place, and time. She has normal strength. No cranial nerve deficit or sensory deficit. GCS eye subscore is 4. GCS verbal subscore is 5. GCS motor subscore is 6.  Finger to nose intact  Skin: Skin is warm and dry. No rash noted.  Psychiatric: She has a normal mood and affect.    ED Course  Procedures (including critical care time) Labs Review Labs Reviewed  ACETAMINOPHEN LEVEL - Abnormal; Notable for the following:    Acetaminophen (Tylenol), Serum <10 (*)    All other components within normal limits  BASIC METABOLIC PANEL  CBC WITH DIFFERENTIAL/PLATELET  ETHANOL  SALICYLATE LEVEL  URINE RAPID DRUG SCREEN (HOSP PERFORMED) NOT AT [facility]  URINALYSIS, ROUTINE W REFLEX MICROSCOPIC (NOT AT [facility])  PREGNANCY, URINE    Imaging Review No results found.   EKG Interpretation None      MDM   Final diagnoses:  Depression    Patient's headache is feeling better after Toradol and Phenergan. Her headache is bifrontal and she doesn't have any findings that would be more concerning for subarachnoid hemorrhage or meningitis. She was assessed by behavioral health and is not felt to need inpatient treatment. She was able to contract for safety. She was discharged home in good condition and was given resources for outpatient behavioral health follow-up by the counselor. Return precautions were given.    Rolan Bucco, MD 10/28/14 2159

## 2014-10-28 NOTE — Discharge Instructions (Signed)

## 2014-10-28 NOTE — ED Notes (Signed)
IV RN unable to draw blood, hospital phlebotomy paged.

## 2014-10-28 NOTE — ED Notes (Signed)
Pt reports dealing with a lot of stress for the last two weeks and feels very depressed, denies SI/HI.

## 2015-01-29 ENCOUNTER — Emergency Department (HOSPITAL_COMMUNITY)
Admission: EM | Admit: 2015-01-29 | Discharge: 2015-01-29 | Disposition: A | Discharge status: Home / Self Care | Payer: 59 | Attending: Emergency Medicine | Admitting: Emergency Medicine

## 2015-01-29 ENCOUNTER — Encounter (HOSPITAL_COMMUNITY): Payer: Self-pay | Admitting: Emergency Medicine

## 2015-01-29 DIAGNOSIS — L0291 Cutaneous abscess, unspecified: Secondary | ICD-10-CM

## 2015-01-29 DIAGNOSIS — J45909 Unspecified asthma, uncomplicated: Secondary | ICD-10-CM | POA: Insufficient documentation

## 2015-01-29 DIAGNOSIS — Z880 Allergy status to penicillin: Secondary | ICD-10-CM | POA: Insufficient documentation

## 2015-01-29 DIAGNOSIS — N61 Inflammatory disorders of breast: Secondary | ICD-10-CM | POA: Insufficient documentation

## 2015-01-29 DIAGNOSIS — L0390 Cellulitis, unspecified: Secondary | ICD-10-CM

## 2015-01-29 DIAGNOSIS — Z79899 Other long term (current) drug therapy: Secondary | ICD-10-CM | POA: Insufficient documentation

## 2015-01-29 MED ORDER — LIDOCAINE-EPINEPHRINE (PF) 2 %-1:200000 IJ SOLN
10.0000 mL | Freq: Once | INTRAMUSCULAR | Status: AC
  Administered 2015-01-29: 10 mL
  Filled 2015-01-29: qty 20

## 2015-01-29 MED ORDER — SULFAMETHOXAZOLE-TRIMETHOPRIM 800-160 MG PO TABS: 1.0000 | ORAL_TABLET | Freq: Two times a day (BID) | ORAL | Status: AC

## 2015-01-29 MED ORDER — BACITRACIN ZINC 500 UNIT/GM EX OINT
1.0000 "application " | TOPICAL_OINTMENT | Freq: Two times a day (BID) | CUTANEOUS | Status: DC
  Administered 2015-01-29: 1 via TOPICAL
  Filled 2015-01-29: qty 0.9

## 2015-01-29 NOTE — Discharge Instructions (Signed)
Please take your antibiotics as directed.It is important to keep your wound clean and dry.You may remove your packing after 2 days. Return to ED for worsening symptoms including fevers, chills, worsening pain, swelling or redness.  Abscess Care After An abscess (also called a boil or furuncle) is an infected area that contains a collection of pus. Signs and symptoms of an abscess include pain, tenderness, redness, or hardness, or you may feel a moveable soft area under your skin. An abscess can occur anywhere in the body. The infection may spread to surrounding tissues causing cellulitis. A cut (incision) by the surgeon was made over your abscess and the pus was drained out. Gauze may have been packed into the space to provide a drain that will allow the cavity to heal from the inside outwards. The boil may be painful for 5 to 7 days. Most people with a boil do not have high fevers. Your abscess, if seen early, may not have localized, and may not have been lanced. If not, another appointment may be required for this if it does not get better on its own or with medications. HOME CARE INSTRUCTIONS   Only take over-the-counter or prescription medicines for pain, discomfort, or fever as directed by your caregiver.  When you bathe, soak and then remove gauze or iodoform packs at least daily or as directed by your caregiver. You may then wash the wound gently with mild soapy water. Repack with gauze or do as your caregiver directs. SEEK IMMEDIATE MEDICAL CARE IF:   You develop increased pain, swelling, redness, drainage, or bleeding in the wound site.  You develop signs of generalized infection including muscle aches, chills, fever, or a general ill feeling.  An oral temperature above 102 F (38.9 C) develops, not controlled by medication. See your caregiver for a recheck if you develop any of the symptoms described above. If medications (antibiotics) were prescribed, take them as directed. Document  Released: 12/03/2004 Document Revised: 08/09/2011 Document Reviewed: 07/31/2007 [facility] Patient Information 2015 [place], [place]. This information is not intended to replace advice given to you by your health care provider. Make sure you discuss any questions you have with your health care provider.

## 2015-01-29 NOTE — ED Notes (Signed)
Per pt, states boil under right breast since Monday

## 2015-01-29 NOTE — ED Notes (Signed)
Dressing applied. 

## 2015-01-29 NOTE — ED Provider Notes (Signed)
CSN: [id number]     Arrival date & time 01/29/15  1132 History  This chart was scribed for non-physician practitioner Joycie Peek, PA-C working with Margarita Grizzle, MD by Murriel Hopper, ED Scribe. This patient was seen in room WTR6/WTR6 and the patient's care was started at 12:19 PM.    Chief Complaint  Patient presents with  . Abscess      The history is provided by the patient. No language interpreter was used.   HPI Comments: Tami Cruz is a 29 y.o. female who presents to the Emergency Department complaining of a constant, worsening 9/10 abscess underneath her right breast that has been present for three days. Pt reports pain worsens when she bends over and states it is painful when she sleeps on it and touches the area. Pt states she has applied a hot towel to the area with little relief. Pt denies drainage, breast tenderness, discharge, fever, chills, or swelling.   Past Medical History  Diagnosis Date  . Asthma   . Migraines    History reviewed. No pertinent past surgical history. No family history on file. Social History  Substance Use Topics  . Smoking status: Never Smoker   . Smokeless tobacco: Never Used  . Alcohol Use: Yes     Comment: socially   OB History    No data available     Review of Systems  Constitutional: Negative for fever and chills.  Gastrointestinal: Negative for nausea and vomiting.  Skin: Positive for wound.      Allergies  Gluten meal and Penicillins  Home Medications   Prior to Admission medications   Medication Sig Start Date End Date Taking? Authorizing Provider  ECHINACEA EXTRACT PO Take 2 drops by mouth 2 (two) times daily. For immune support   Yes Historical Provider, MD  Multiple Vitamin (MULTIVITAMIN WITH MINERALS) TABS tablet Take 1 tablet by mouth daily.   Yes Historical Provider, MD  HYDROcodone-acetaminophen (NORCO/VICODIN) 5-325 MG per tablet Take 1-2 tablets by mouth every 6 (six) hours as needed. Patient  not taking: Reported on 10/28/2014 10/09/14   Roxy Horseman, PA-C  predniSONE (DELTASONE) 20 MG tablet 3 tabs po day one, then 2 po daily x 4 days Patient not taking: Reported on 10/02/2014 10/10/13   Rolan Bucco, MD  sulfamethoxazole-trimethoprim (BACTRIM DS,SEPTRA DS) 800-160 MG per tablet Take 1 tablet by mouth 2 (two) times daily. 01/29/15 02/05/15  Joycie Peek, PA-C   BP 136/74 mmHg  Pulse 91  Temp(Src) 98.7 F (37.1 C) (Oral)  Resp 18  SpO2 100%  LMP 01/08/2015 Physical Exam  Constitutional: She is oriented to person, place, and time. She appears well-developed and well-nourished.  HENT:  Head: Normocephalic and atraumatic.  Mouth/Throat: Oropharynx is clear and moist. No oropharyngeal exudate.  Cardiovascular: Normal rate and regular rhythm.   Pulmonary/Chest: Effort normal.  Area of erythema and induration under right breast.  Abdominal: Soft. She exhibits no distension. There is no tenderness.  Neurological: She is alert and oriented to person, place, and time.  Skin: Skin is warm and dry.  Psychiatric: She has a normal mood and affect.  Nursing note and vitals reviewed.   ED Course  Procedures (including critical care time) INCISION AND DRAINAGE Performed by: Sharlene Motts Consent: Verbal consent obtained. Risks and benefits: risks, benefits and alternatives were discussed Type: abscess  Body area: Right breast  Anesthesia: local infiltration  Incision was made with a scalpel.  Local anesthetic: lidocaine 2% With epinephrine  Anesthetic total: 3  ml  Complexity: complex Blunt dissection to break up loculations  Drainage: purulent  Drainage amount: moderate  Packing material: 1/4 in iodoform gauze  Patient tolerance: Patient tolerated the procedure well with no immediate complications.  EMERGENCY DEPARTMENT US SOFT TISSUE INTERPRETATION "Study: Limited Ultrasound of the noted body part in comments below"  INDICATIONS: Soft tissue  infection Multiple views of the body part are obtained with a multi-frequency linear probe  PERFORMED BY:  Myself  IMAGES ARCHIVED?: Yes  SIDE:Right   BODY PART:Chest Wall  FINDINGS: Abcess present  LIMITATIONS:  Body Habitus  INTERPRETATION:  Abcess present  COMMENT:  Localized abscess with surrounding cellulitis.       DIAGNOSTIC STUDIES: Oxygen Saturation is 99% on room air, normal by my interpretation.    COORDINATION OF CARE: 12:21 PM Discussed treatment plan with pt at bedside and pt agreed to plan.   Labs Review Labs Reviewed - No data to display  Imaging Review No results found.    EKG Interpretation None     Meds given in ED:  Medications  lidocaine-EPINEPHrine (XYLOCAINE W/EPI) 2 %-1:200000 (PF) injection 10 mL (10 mLs Infiltration Given by Other 01/29/15 1430)    Discharge Medication List as of 01/29/2015  2:07 PM    START taking these medications   Details  sulfamethoxazole-trimethoprim (BACTRIM DS,SEPTRA DS) 800-160 MG per tablet Take 1 tablet by mouth 2 (two) times daily., Starting 01/29/2015, Until Wed 02/05/15, Print       Filed Vitals:   01/29/15 1144 01/29/15 1431  BP: 141/75 136/74  Pulse: 87 91  Temp: 98.7 F (37.1 C)   TempSrc: Oral   Resp: 17 18  SpO2: 99% 100%    MDM  Vitals stable - WNL -afebrile Pt resting comfortably in ED. Abscess with cellulitis to inferior right breast. No evidence of mastitis. Abscess confirmed via ultrasound. Abscess drained at bedside by myself, will initiate antibiotics.Instructed patient to remove packing in 2 days  I discussed all relevant lab findings and imaging results with pt and they verbalized understanding. Discussed f/u with PCP within 48 hrs and return precautions, pt very amenable to plan.  Final diagnoses:  Abscess and cellulitis    I personally performed the services described in this documentation, which was scribed in my presence. The recorded information has been reviewed and  is accurate.    Joycie Peek, PA-C 01/29/15 2049  Margarita Grizzle, MD 01/30/15 [phone]

## 2015-01-30 ENCOUNTER — Encounter (HOSPITAL_COMMUNITY): Payer: Self-pay | Admitting: Emergency Medicine

## 2015-01-30 ENCOUNTER — Emergency Department (HOSPITAL_COMMUNITY)
Admission: EM | Admit: 2015-01-30 | Discharge: 2015-01-30 | Disposition: A | Discharge status: Home / Self Care | Payer: 59 | Attending: Emergency Medicine | Admitting: Emergency Medicine

## 2015-01-30 DIAGNOSIS — Z880 Allergy status to penicillin: Secondary | ICD-10-CM | POA: Insufficient documentation

## 2015-01-30 DIAGNOSIS — L0291 Cutaneous abscess, unspecified: Secondary | ICD-10-CM

## 2015-01-30 DIAGNOSIS — Z8679 Personal history of other diseases of the circulatory system: Secondary | ICD-10-CM | POA: Insufficient documentation

## 2015-01-30 DIAGNOSIS — Z79899 Other long term (current) drug therapy: Secondary | ICD-10-CM | POA: Insufficient documentation

## 2015-01-30 DIAGNOSIS — J45909 Unspecified asthma, uncomplicated: Secondary | ICD-10-CM | POA: Insufficient documentation

## 2015-01-30 DIAGNOSIS — L0390 Cellulitis, unspecified: Secondary | ICD-10-CM

## 2015-01-30 DIAGNOSIS — N61 Inflammatory disorders of breast: Secondary | ICD-10-CM | POA: Insufficient documentation

## 2015-01-30 MED ORDER — TRAMADOL HCL 50 MG PO TABS: 50.0000 mg | ORAL_TABLET | Freq: Four times a day (QID) | ORAL | Status: DC | PRN

## 2015-01-30 NOTE — ED Notes (Signed)
AVS explained in detail. Knows to follow up with PCP in three days for wound re-check. Given gauze for home wound care. No other c/c.

## 2015-01-30 NOTE — ED Notes (Signed)
Pt reports having an abscess lanced here yesterday with increased pain today. Has visible serosanguineous drainage from abscess underneath right breast and has pus actively draining from open area. Denies fever/N/V/D. No other c/c.

## 2015-01-30 NOTE — ED Provider Notes (Signed)
History  This chart was scribed for non-physician practitioner, Earley Favor, FNP,working with Alvira Monday, MD, by Karle Plumber, ED Scribe. This patient was seen in room WTR5/WTR5 and the patient's care was started at 8:19 PM.  Chief Complaint  Patient presents with  . Abscess   The history is provided by the patient and medical records. No language interpreter was used.    HPI Comments:  Tami Cruz is a 29 y.o. female who presents to the Emergency Department complaining of increased pain from an abscess located underneath her right breast that she had incised and drained here yesterday. She states she has taken Tylenol with no significant relief of the pain. She reports being prescribed Septra and has taken 3 doses since the procedure. She denies modifying factors of the pain. She denies fever, chills, nausea, vomiting or red streaking.  Past Medical History  Diagnosis Date  . Asthma   . Migraines    History reviewed. No pertinent past surgical history. History reviewed. No pertinent family history. Social History  Substance Use Topics  . Smoking status: Never Smoker   . Smokeless tobacco: Never Used  . Alcohol Use: Yes     Comment: socially   OB History    No data available     Review of Systems  Constitutional: Negative for fever and chills.  Gastrointestinal: Negative for nausea, vomiting and diarrhea.  Skin: Positive for wound (incised abscess under right breast). Negative for color change.  All other systems reviewed and are negative.   Allergies  Gluten meal and Penicillins  Home Medications   Prior to Admission medications   Medication Sig Start Date End Date Taking? Authorizing Provider  ECHINACEA EXTRACT PO Take 2 drops by mouth daily. For immune support   Yes Historical Provider, MD  Multiple Vitamin (MULTIVITAMIN WITH MINERALS) TABS tablet Take 1 tablet by mouth daily.   Yes Historical Provider, MD  sulfamethoxazole-trimethoprim (BACTRIM  DS,SEPTRA DS) 800-160 MG per tablet Take 1 tablet by mouth 2 (two) times daily. 01/29/15 02/05/15 Yes Benjamin Cartner, PA-C  HYDROcodone-acetaminophen (NORCO/VICODIN) 5-325 MG per tablet Take 1-2 tablets by mouth every 6 (six) hours as needed. Patient not taking: Reported on 10/28/2014 10/09/14   Roxy Horseman, PA-C  predniSONE (DELTASONE) 20 MG tablet 3 tabs po day one, then 2 po daily x 4 days Patient not taking: Reported on 10/02/2014 10/10/13   Rolan Bucco, MD  traMADol (ULTRAM) 50 MG tablet Take 1 tablet (50 mg total) by mouth every 6 (six) hours as needed. 01/30/15   Earley Favor, NP   Triage Vitals: BP 156/88 mmHg  Pulse 74  Temp(Src) 97.8 F (36.6 C)  Resp 20  SpO2 100%  LMP 01/08/2015 Physical Exam  Constitutional: She is oriented to person, place, and time. She appears well-developed and well-nourished.  HENT:  Head: Normocephalic and atraumatic.  Eyes: EOM are normal.  Neck: Normal range of motion.  Cardiovascular: Normal rate.   Pulmonary/Chest: Effort normal.  small draining site with surrounding erythremia on underside of R breast   Musculoskeletal: Normal range of motion.  Neurological: She is alert and oriented to person, place, and time.  Skin: Skin is warm and dry. There is erythema.  Psychiatric: She has a normal mood and affect. Her behavior is normal.  Nursing note and vitals reviewed.   ED Course  Procedures (including critical care time) DIAGNOSTIC STUDIES: Oxygen Saturation is 100% on RA, normal by my interpretation.   COORDINATION OF CARE: 8:20 PM- Will prescribe tramadol for  pain. Pt verbalizes understanding and agrees to plan.  Medications - No data to display  Labs Review Labs Reviewed - No data to display  Imaging Review No results found. I have personally reviewed and evaluated these images and lab results as part of my medical decision-making.   EKG Interpretation None     Patinet is here for pain control only states has used Motrin X1  wihtout relief MDM   Final diagnoses:  Abscess and cellulitis       I personally performed the services described in this documentation, which was scribed in my presence. The recorded information has been reviewed and is accurate.    Earley Favor, NP 01/30/15 [id number]  Alvira Monday, MD 01/31/15 1332

## 2015-01-30 NOTE — Discharge Instructions (Signed)
Cellulitis Cellulitis is an infection of the skin and the tissue under the skin. The infected area is usually red and tender. This happens most often in the arms and lower legs. HOME CARE   Take your antibiotic medicine as told. Finish the medicine even if you start to feel better.  Keep the infected arm or leg raised (elevated).  Put a warm cloth on the area up to 4 times per day.  Only take medicines as told by your doctor.  Keep all doctor visits as told. GET HELP IF:  You see red streaks on the skin coming from the infected area.  Your red area gets bigger or turns a dark color.  Your bone or joint under the infected area is painful after the skin heals.  Your infection comes back in the same area or different area.  You have a puffy (swollen) bump in the infected area.  You have new symptoms.  You have a fever. GET HELP RIGHT AWAY IF:   You feel very sleepy.  You throw up (vomit) or have watery poop (diarrhea).  You feel sick and have muscle aches and pains. MAKE SURE YOU:   Understand these instructions.  Will watch your condition.  Will get help right away if you are not doing well or get worse. Document Released: 11/03/2007 Document Revised: 10/01/2013 Document Reviewed: 08/02/2011 [facility] Patient Information 2015 [place], [place]. This information is not intended to replace advice given to you by your health care provider. Make sure you discuss any questions you have with your health care provider. Continue taking the antibiotic you can safely take tylenol or mortin while at work and Ultram in the morning and evening

## 2015-04-28 ENCOUNTER — Emergency Department (HOSPITAL_COMMUNITY): Payer: 59

## 2015-04-28 ENCOUNTER — Encounter (HOSPITAL_COMMUNITY): Payer: Self-pay | Admitting: Emergency Medicine

## 2015-04-28 ENCOUNTER — Emergency Department (HOSPITAL_COMMUNITY)
Admission: EM | Admit: 2015-04-28 | Discharge: 2015-04-28 | Disposition: A | Discharge status: Home / Self Care | Payer: 59 | Attending: Emergency Medicine | Admitting: Emergency Medicine

## 2015-04-28 DIAGNOSIS — Z8679 Personal history of other diseases of the circulatory system: Secondary | ICD-10-CM | POA: Insufficient documentation

## 2015-04-28 DIAGNOSIS — Z880 Allergy status to penicillin: Secondary | ICD-10-CM | POA: Insufficient documentation

## 2015-04-28 DIAGNOSIS — J018 Other acute sinusitis: Secondary | ICD-10-CM | POA: Insufficient documentation

## 2015-04-28 DIAGNOSIS — J45909 Unspecified asthma, uncomplicated: Secondary | ICD-10-CM | POA: Insufficient documentation

## 2015-04-28 DIAGNOSIS — Z79899 Other long term (current) drug therapy: Secondary | ICD-10-CM | POA: Insufficient documentation

## 2015-04-28 MED ORDER — DOXYCYCLINE HYCLATE 100 MG PO CAPS: 100.0000 mg | ORAL_CAPSULE | Freq: Two times a day (BID) | ORAL | Status: DC

## 2015-04-28 MED ORDER — TRIAMCINOLONE ACETONIDE 55 MCG/ACT NA AERO: 2.0000 | INHALATION_SPRAY | Freq: Every day | NASAL | Status: DC

## 2015-04-28 NOTE — Discharge Instructions (Signed)

## 2015-04-28 NOTE — ED Provider Notes (Signed)
CSN: [id number]     Arrival date & time 04/28/15  [id number] History   First MD Initiated Contact with Patient 04/28/15 1015     Chief Complaint  Patient presents with  . Chills  . Generalized Body Aches  . Cough     (Consider location/radiation/quality/duration/timing/severity/associated sxs/prior Treatment) HPI Comments: Pt comes in with c/o cough, nasal congestion sore throat and headache. States that the symptoms have been going on for 3 weeks. She has been using otc medications without relief. Had fever initially but not in the last week.  The history is provided by the patient. No language interpreter was used.    Past Medical History  Diagnosis Date  . Asthma   . Migraines    History reviewed. No pertinent past surgical history. No family history on file. Social History  Substance Use Topics  . Smoking status: Never Smoker   . Smokeless tobacco: Never Used  . Alcohol Use: Yes     Comment: socially   OB History    No data available     Review of Systems  All other systems reviewed and are negative.     Allergies  Gluten meal and Penicillins  Home Medications   Prior to Admission medications   Medication Sig Start Date End Date Taking? Authorizing Provider  doxycycline (VIBRAMYCIN) 100 MG capsule Take 1 capsule (100 mg total) by mouth 2 (two) times daily. 04/28/15   Teressa LowerVrinda Nianna Igo, NP  ECHINACEA EXTRACT PO Take 2 drops by mouth daily. For immune support    Historical Provider, MD  HYDROcodone-acetaminophen (NORCO/VICODIN) 5-325 MG per tablet Take 1-2 tablets by mouth every 6 (six) hours as needed. Patient not taking: Reported on 10/28/2014 10/09/14   Roxy Horsemanobert Browning, PA-C  Multiple Vitamin (MULTIVITAMIN WITH MINERALS) TABS tablet Take 1 tablet by mouth daily.    Historical Provider, MD  predniSONE (DELTASONE) 20 MG tablet 3 tabs po day one, then 2 po daily x 4 days Patient not taking: Reported on 10/02/2014 10/10/13   Rolan BuccoMelanie Belfi, MD  traMADol (ULTRAM) 50 MG  tablet Take 1 tablet (50 mg total) by mouth every 6 (six) hours as needed. 01/30/15   Earley FavorGail Schulz, NP  triamcinolone (NASACORT AQ) 55 MCG/ACT AERO nasal inhaler Place 2 sprays into the nose daily. 04/28/15   Teressa LowerVrinda Teddie Curd, NP   BP 138/83 mmHg  Pulse 81  Temp(Src) 98.3 F (36.8 C) (Oral)  Resp 18  SpO2 100%  LMP 04/14/2015 Physical Exam  Constitutional: She is oriented to person, place, and time. She appears well-developed and well-nourished.  HENT:  Right Ear: External ear normal.  Left Ear: External ear normal.  Nose: Mucosal edema present. Right sinus exhibits maxillary sinus tenderness and frontal sinus tenderness. Left sinus exhibits maxillary sinus tenderness and frontal sinus tenderness.  Mouth/Throat: Posterior oropharyngeal erythema present.  Eyes: Conjunctivae and EOM are normal. Pupils are equal, round, and reactive to light.  Neck: Neck supple.  Cardiovascular: Normal rate and regular rhythm.   Pulmonary/Chest: Effort normal and breath sounds normal.  Musculoskeletal: Normal range of motion.  Neurological: She is alert and oriented to person, place, and time.  Skin: Skin is dry.  Nursing note and vitals reviewed.   ED Course  Procedures (including critical care time) Labs Review Labs Reviewed - No data to display  Imaging Review Dg Chest 2 View  04/28/2015  CLINICAL DATA:  Cough. EXAM: CHEST  2 VIEW COMPARISON:  None. FINDINGS: Mediastinum and hilar structures normal. Lungs are clear. Heart size normal. No  pleural effusion or pneumothorax. No acute bony abnormality . IMPRESSION: New no acute cardiopulmonary disease. Electronically Signed   By: Maisie Fus  Register   On: 04/28/2015 09:14   I have personally reviewed and evaluated these images and lab results as part of my medical decision-making.   EKG Interpretation None      MDM   Final diagnoses:  Other acute sinusitis    Will treat for sinusitis with doxycycline as she is pcn allergic and nasacort.  Discussed return precautions   Teressa Lower, NP 04/28/15 1041  Tilden Fossa, MD 04/30/15 [phone]

## 2015-04-28 NOTE — ED Notes (Signed)
Pt c/o "cold symptoms over 3 weeks now. I have been taking cough meds but still going on.  Sometimes cough is dry then other times it gets something up".  Pt c/o body aches, chills and cough.

## 2015-04-28 NOTE — Progress Notes (Signed)
Pt c/o body aches and nasal congestion times three weeks. She has a cough with tan production. Pt c/o severe sinus pressure. Pt has taken mucinex and nyquil and not feeling any better. Pt does not appear in distress.

## 2015-08-14 ENCOUNTER — Encounter (HOSPITAL_COMMUNITY): Payer: Self-pay | Admitting: Emergency Medicine

## 2015-08-14 ENCOUNTER — Emergency Department (HOSPITAL_COMMUNITY): Payer: Self-pay

## 2015-08-14 ENCOUNTER — Emergency Department (HOSPITAL_COMMUNITY)
Admission: EM | Admit: 2015-08-14 | Discharge: 2015-08-14 | Disposition: A | Discharge status: Home / Self Care | Payer: Self-pay | Attending: Emergency Medicine | Admitting: Emergency Medicine

## 2015-08-14 DIAGNOSIS — Z7951 Long term (current) use of inhaled steroids: Secondary | ICD-10-CM | POA: Insufficient documentation

## 2015-08-14 DIAGNOSIS — Z79899 Other long term (current) drug therapy: Secondary | ICD-10-CM | POA: Insufficient documentation

## 2015-08-14 DIAGNOSIS — Z8679 Personal history of other diseases of the circulatory system: Secondary | ICD-10-CM | POA: Insufficient documentation

## 2015-08-14 DIAGNOSIS — Z880 Allergy status to penicillin: Secondary | ICD-10-CM | POA: Insufficient documentation

## 2015-08-14 DIAGNOSIS — J988 Other specified respiratory disorders: Secondary | ICD-10-CM

## 2015-08-14 DIAGNOSIS — B9789 Other viral agents as the cause of diseases classified elsewhere: Secondary | ICD-10-CM

## 2015-08-14 DIAGNOSIS — Z792 Long term (current) use of antibiotics: Secondary | ICD-10-CM | POA: Insufficient documentation

## 2015-08-14 DIAGNOSIS — J45901 Unspecified asthma with (acute) exacerbation: Secondary | ICD-10-CM | POA: Insufficient documentation

## 2015-08-14 DIAGNOSIS — J069 Acute upper respiratory infection, unspecified: Secondary | ICD-10-CM | POA: Insufficient documentation

## 2015-08-14 MED ORDER — GUAIFENESIN 100 MG/5ML PO LIQD: 100.0000 mg | ORAL | Status: DC | PRN

## 2015-08-14 MED ORDER — BENZONATATE 100 MG PO CAPS: 100.0000 mg | ORAL_CAPSULE | Freq: Three times a day (TID) | ORAL | Status: DC | PRN

## 2015-08-14 NOTE — ED Notes (Signed)
Patient transported to X-ray 

## 2015-08-14 NOTE — Discharge Instructions (Signed)
Viral Infections °A viral infection can be caused by different types of viruses. Most viral infections are not serious and resolve on their own. However, some infections may cause severe symptoms and may lead to further complications. °SYMPTOMS °Viruses can frequently cause: °· Minor sore throat. °· Aches and pains. °· Headaches. °· Runny nose. °· Different types of rashes. °· Watery eyes. °· Tiredness. °· Cough. °· Loss of appetite. °· Gastrointestinal infections, resulting in nausea, vomiting, and diarrhea. °These symptoms do not respond to antibiotics because the infection is not caused by bacteria. However, you might catch a bacterial infection following the viral infection. This is sometimes called a "superinfection." Symptoms of such a bacterial infection may include: °· Worsening sore throat with pus and difficulty swallowing. °· Swollen neck glands. °· Chills and a high or persistent fever. °· Severe headache. °· Tenderness over the sinuses. °· Persistent overall ill feeling (malaise), muscle aches, and tiredness (fatigue). °· Persistent cough. °· Yellow, green, or brown mucus production with coughing. °HOME CARE INSTRUCTIONS  °· Only take over-the-counter or prescription medicines for pain, discomfort, diarrhea, or fever as directed by your caregiver. °· Drink enough water and fluids to keep your urine clear or pale yellow. Sports drinks can provide valuable electrolytes, sugars, and hydration. °· Get plenty of rest and maintain proper nutrition. Soups and broths with crackers or rice are fine. °SEEK IMMEDIATE MEDICAL CARE IF:  °· You have severe headaches, shortness of breath, chest pain, neck pain, or an unusual rash. °· You have uncontrolled vomiting, diarrhea, or you are unable to keep down fluids. °· You or your child has an oral temperature above 102° F (38.9° C), not controlled by medicine. °· Your baby is older than 3 months with a rectal temperature of 102° F (38.9° C) or higher. °· Your baby is 3  months old or younger with a rectal temperature of 100.4° F (38° C) or higher. °MAKE SURE YOU:  °· Understand these instructions. °· Will watch your condition. °· Will get help right away if you are not doing well or get worse. °  °This information is not intended to replace advice given to you by your health care provider. Make sure you discuss any questions you have with your health care provider. °  °Document Released: 02/24/2005 Document Revised: 08/09/2011 Document Reviewed: 10/23/2014 °Elsevier Interactive Patient Education ©2016 [employer] ° °

## 2015-08-14 NOTE — ED Notes (Signed)
Patient presents for cough, generalized body aches and chills x6 days. Denies N/V/D, fever at home. No relief with OTC meds.

## 2015-08-14 NOTE — ED Provider Notes (Signed)
CSN: [id number]     Arrival date & time 08/14/15  1450 History  By signing my name below, I, Freida BusmanDiana Omoyeni, attest that this documentation has been prepared under the direction and in the presence of non-physician practitioner, Fayrene HelperBowie Ricki Vanhandel, PA-C. Electronically Signed: Freida Busmaniana Omoyeni, Scribe. 08/14/2015. 3:45 PM.    Chief Complaint  Patient presents with  . Cough  . Generalized Body Aches    The history is provided by the patient. No language interpreter was used.     HPI Comments:  Tami Cruz is a 30 y.o. female with a history of asthma,  who presents to the Emergency Department complaining of productive cough x 1 week. She reports associated chills, generalized body aches, sore throat, nasal congestion at night and wheezing when ambulating She denies HA, ear pain, nausea vomit diarrhea.  Have been taking OTC meds and inhaler with minimal relief. No recent hospitalizations secondary to asthma, notes last hospitalization was as a child.     Past Medical History  Diagnosis Date  . Asthma   . Migraines    History reviewed. No pertinent past surgical history. No family history on file. Social History  Substance Use Topics  . Smoking status: Never Smoker   . Smokeless tobacco: Never Used  . Alcohol Use: Yes     Comment: socially   OB History    No data available     Review of Systems  All other systems reviewed and are negative.     Allergies  Gluten meal and Penicillins  Home Medications   Prior to Admission medications   Medication Sig Start Date End Date Taking? Authorizing Provider  doxycycline (VIBRAMYCIN) 100 MG capsule Take 1 capsule (100 mg total) by mouth 2 (two) times daily. 04/28/15   Teressa LowerVrinda Pickering, NP  ECHINACEA EXTRACT PO Take 2 drops by mouth daily. For immune support    Historical Provider, MD  HYDROcodone-acetaminophen (NORCO/VICODIN) 5-325 MG per tablet Take 1-2 tablets by mouth every 6 (six) hours as needed. Patient not taking: Reported  on 10/28/2014 10/09/14   Roxy Horsemanobert Browning, PA-C  Multiple Vitamin (MULTIVITAMIN WITH MINERALS) TABS tablet Take 1 tablet by mouth daily.    Historical Provider, MD  predniSONE (DELTASONE) 20 MG tablet 3 tabs po day one, then 2 po daily x 4 days Patient not taking: Reported on 10/02/2014 10/10/13   Rolan BuccoMelanie Belfi, MD  traMADol (ULTRAM) 50 MG tablet Take 1 tablet (50 mg total) by mouth every 6 (six) hours as needed. 01/30/15   Earley FavorGail Schulz, NP  triamcinolone (NASACORT AQ) 55 MCG/ACT AERO nasal inhaler Place 2 sprays into the nose daily. 04/28/15   Teressa LowerVrinda Pickering, NP   BP 135/81 mmHg  Pulse 78  Temp(Src) 98 F (36.7 C) (Oral)  Resp 18  SpO2 100%  LMP 07/25/2015 Physical Exam  Constitutional: She is oriented to person, place, and time. She appears well-developed and well-nourished. No distress.  HENT:  Head: Normocephalic and atraumatic.  Right Ear: Tympanic membrane normal.  Left Ear: Tympanic membrane normal.  Nose: Nose normal.  Mouth/Throat: Uvula is midline, oropharynx is clear and moist and mucous membranes are normal.  Eyes: Conjunctivae are normal.  Cardiovascular: Normal rate.   Pulmonary/Chest: Effort normal and breath sounds normal. No respiratory distress. She has no wheezes. She has no rhonchi. She has no rales.  Abdominal: She exhibits no distension.  Lymphadenopathy:    She has no cervical adenopathy.  Neurological: She is alert and oriented to person, place, and time.  Skin: Skin  is warm and dry.  Psychiatric: She has a normal mood and affect.  Nursing note and vitals reviewed.   ED Course  Procedures   DIAGNOSTIC STUDIES:  Oxygen Saturation is 100% on RA, normal by my interpretation.    COORDINATION OF CARE:  3:48 PM Discussed treatment plan with pt at bedside and pt agreed to plan.   MDM   Pt symptoms consistent with URI. CXR negative for acute infiltrate. Pt will be discharged with symptomatic treatment.  Discussed return precautions.  Pt is hemodynamically  stable & in NAD prior to discharge.  Final diagnoses:  Viral respiratory infection    BP 135/81 mmHg  Pulse 78  Temp(Src) 98 F (36.7 C) (Oral)  Resp 18  SpO2 100%  LMP 07/25/2015  I personally performed the services described in this documentation, which was scribed in my presence. The recorded information has been reviewed and is accurate.      Fayrene Helper, PA-C 08/14/15 1559  Fayrene Helper, PA-C 08/14/15 1610  Gerhard Munch, MD 08/15/15 [phone]

## 2015-08-29 LAB — GLUCOSE, POCT (MANUAL RESULT ENTRY): POC GLUCOSE: 83 mg/dL (ref 70–99)

## 2016-02-17 IMAGING — CR DG ANKLE COMPLETE 3+V*R*
3 series · 3 of 3 positions shown · non-contrast
Comparison: 09/13/2009.

CLINICAL DATA: 29-year-old female missed a step and fell with
twisting injury. Medial pain. Initial encounter.

EXAM:
RIGHT ANKLE - COMPLETE 3+ VIEW

[x ankle ap right]
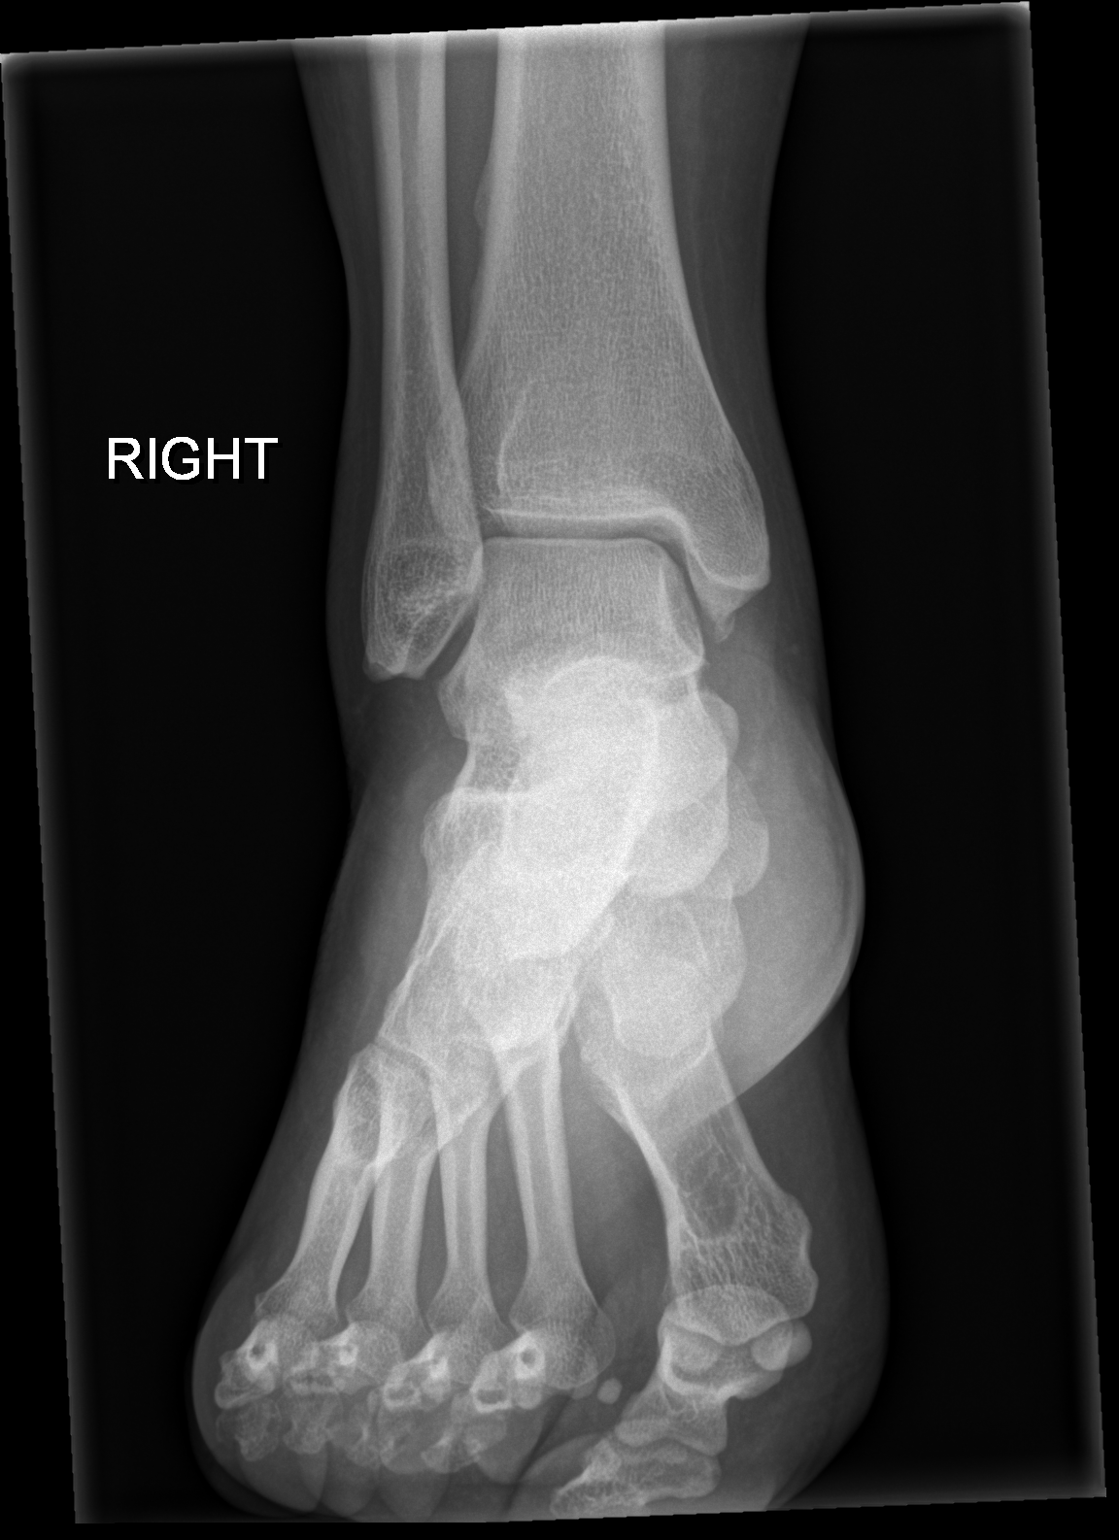

[x ankle obl right]
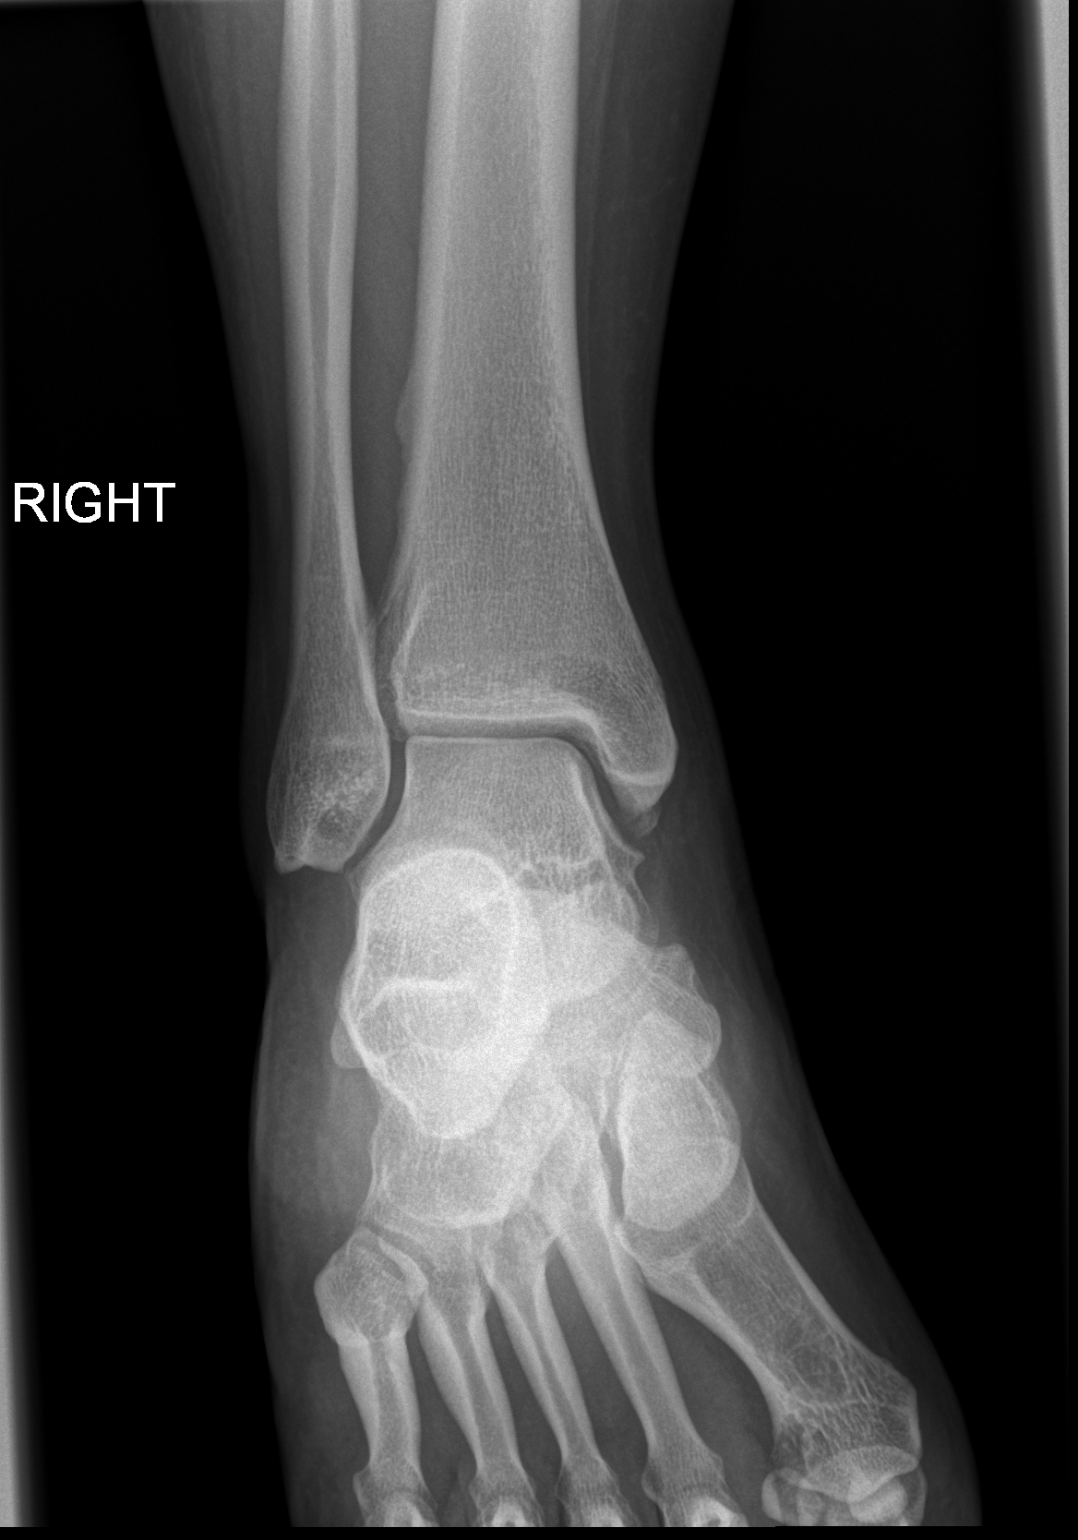

[x ankle lat right]
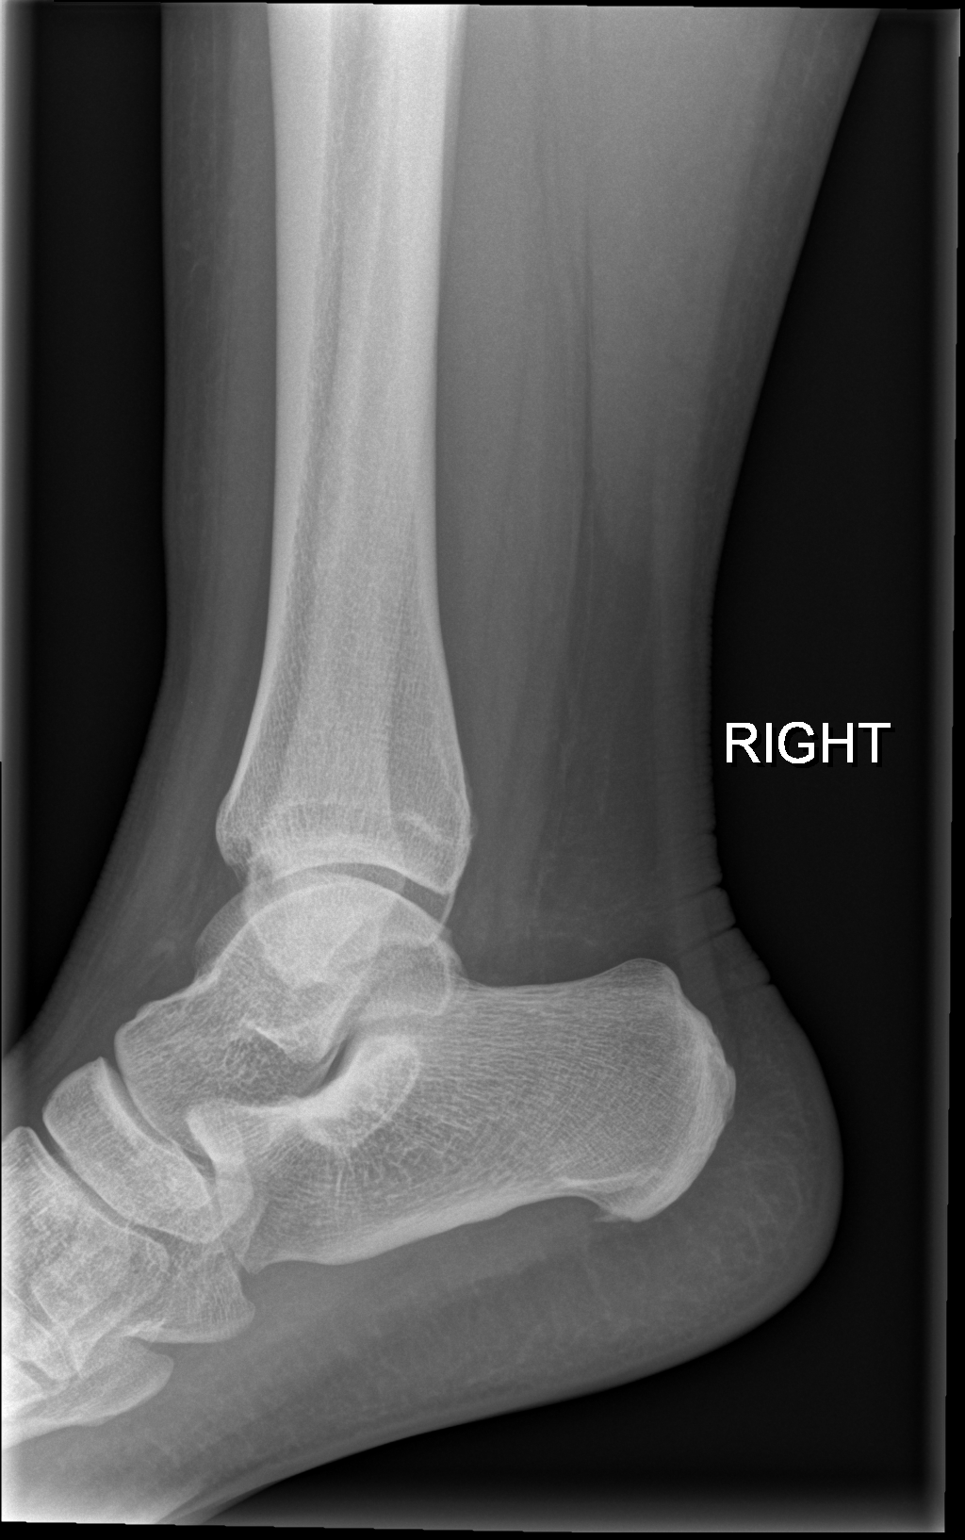

[3 of 3 positions shown; findings below may reference images not displayed]

FINDINGS: Bone mineralization is within normal limits. Mortise joint alignment
preserved. No joint effusion identified. Talar dome intact.
Calcaneus intact. The distal fibula appears intact. There is a small
5 mm avulsion fracture at the medial malleolus. No other acute
osseous abnormality.
IMPRESSION: Small avulsion fracture medial malleolus.

## 2016-05-12 ENCOUNTER — Emergency Department (HOSPITAL_COMMUNITY)
Admission: EM | Admit: 2016-05-12 | Discharge: 2016-05-12 | Disposition: A | Discharge status: Home / Self Care | Payer: Self-pay | Attending: Emergency Medicine | Admitting: Emergency Medicine

## 2016-05-12 ENCOUNTER — Encounter (HOSPITAL_COMMUNITY): Payer: Self-pay

## 2016-05-12 DIAGNOSIS — K0889 Other specified disorders of teeth and supporting structures: Secondary | ICD-10-CM | POA: Insufficient documentation

## 2016-05-12 DIAGNOSIS — Z79899 Other long term (current) drug therapy: Secondary | ICD-10-CM | POA: Insufficient documentation

## 2016-05-12 DIAGNOSIS — J45909 Unspecified asthma, uncomplicated: Secondary | ICD-10-CM | POA: Insufficient documentation

## 2016-05-12 MED ORDER — NAPROXEN 500 MG PO TABS: 500.0000 mg | ORAL_TABLET | Freq: Two times a day (BID) | ORAL | 0 refills | Status: DC

## 2016-05-12 MED ORDER — HYDROCODONE-ACETAMINOPHEN 5-325 MG PO TABS
1.0000 | ORAL_TABLET | Freq: Once | ORAL | Status: AC
  Administered 2016-05-12: 1 via ORAL
  Filled 2016-05-12: qty 1

## 2016-05-12 MED ORDER — CLINDAMYCIN HCL 150 MG PO CAPS: 300.0000 mg | ORAL_CAPSULE | Freq: Three times a day (TID) | ORAL | 0 refills | Status: DC

## 2016-05-12 NOTE — Discharge Instructions (Signed)
I have given you a referral to the dentist. Please call them within 24-48 hours and stated that you were seen in the ED. You may take the naproxen twice a day as needed for pain and swelling. Please do not take ibuprofen, Advil, Motrin on top of this. You may take Tylenol with the naproxen. Applying heat to the outer jawline will help with pain. He may use over-the-counter Orajel over the tooth. Please take the antibiotics as prescribed for 7 days. Return to the ED if you develop worsening facial swelling, fevers, inability to opening her mouth or for any other reason.

## 2016-05-12 NOTE — ED Triage Notes (Signed)
PT C/O PAIN TO THE LEFT WISDOM TOOTH SINCE Monday. DENIES SWELLING OR FEVER. PT STS SHE DOES NOT HAVE A DENTIST.

## 2016-05-12 NOTE — ED Provider Notes (Signed)
[facility]-EMERGENCY DEPT Provider Note   CSN: [id number] Arrival date & time: 05/12/16  1205  By signing my name below, I, Teofilo PodMatthew P. Jamison, attest that this documentation has been prepared under the direction and in the presence of Azucena Kubayler Leaphart, PA-C. Electronically Signed: Teofilo PodMatthew P. Jamison, ED Scribe. 05/12/2016. 12:30 PM.    History   Chief Complaint Chief Complaint  Patient presents with  . Dental Pain    The history is provided by the patient. No language interpreter was used.   HPI Comments:  Tami Cruz is a 30 y.o. female who presents to the Emergency Department complaining of constant, worsening lower left dental pain x 1 day. Pt states that she believes that her wisdom tooth is coming in, and she has not had any wisdom tooth extractions previously.Pt does not have a dentist. Pt reports previous history of a dental abscess. Pt reports a known allergy to penicillin. Pt has taken ibuprofen with no relief, and notes that eating ice chips provides mild relief. Pt denies fever,  facial swelling, ha, nausea, emesis.  Past Medical History:  Diagnosis Date  . Asthma   . Migraines     Patient Active Problem List   Diagnosis Date Noted  . Headache(784.0) 01/26/2013    History reviewed. No pertinent surgical history.  OB History    No data available       Home Medications    Prior to Admission medications   Medication Sig Start Date End Date Taking? Authorizing Provider  benzonatate (TESSALON) 100 MG capsule Take 1 capsule (100 mg total) by mouth 3 (three) times daily as needed for cough. 08/14/15   Fayrene HelperBowie Tran, PA-C  doxycycline (VIBRAMYCIN) 100 MG capsule Take 1 capsule (100 mg total) by mouth 2 (two) times daily. 04/28/15   Teressa LowerVrinda Pickering, NP  ECHINACEA EXTRACT PO Take 2 drops by mouth daily. For immune support    Historical Provider, MD  guaiFENesin (ROBITUSSIN) 100 MG/5ML liquid Take 5-10 mLs (100-200 mg total) by mouth every 4 (four) hours as needed  for congestion. 08/14/15   Fayrene HelperBowie Tran, PA-C  HYDROcodone-acetaminophen (NORCO/VICODIN) 5-325 MG per tablet Take 1-2 tablets by mouth every 6 (six) hours as needed. Patient not taking: Reported on 10/28/2014 10/09/14   Roxy Horsemanobert Browning, PA-C  Multiple Vitamin (MULTIVITAMIN WITH MINERALS) TABS tablet Take 1 tablet by mouth daily.    Historical Provider, MD  predniSONE (DELTASONE) 20 MG tablet 3 tabs po day one, then 2 po daily x 4 days Patient not taking: Reported on 10/02/2014 10/10/13   Rolan BuccoMelanie Belfi, MD  traMADol (ULTRAM) 50 MG tablet Take 1 tablet (50 mg total) by mouth every 6 (six) hours as needed. 01/30/15   Earley FavorGail Schulz, NP  triamcinolone (NASACORT AQ) 55 MCG/ACT AERO nasal inhaler Place 2 sprays into the nose daily. 04/28/15   Teressa LowerVrinda Pickering, NP    Family History History reviewed. No pertinent family history.  Social History Social History  Substance Use Topics  . Smoking status: Never Smoker  . Smokeless tobacco: Never Used  . Alcohol use Yes     Comment: socially     Allergies   Gluten meal and Penicillins   Review of Systems Review of Systems  Constitutional: Negative for fever.  HENT: Positive for dental problem.   Neurological: Positive for numbness.  All other systems reviewed and are negative.    Physical Exam Updated Vital Signs BP 133/88 (BP Location: Right Arm)   Pulse 72   Temp 97.6 F (36.4 C) (Oral)  Resp 16   Ht 5\' 5"  (1.651 m)   Wt 230 lb (104.3 kg)   LMP 04/30/2016   SpO2 100%   BMI 38.27 kg/m   Physical Exam  Constitutional: She appears well-developed and well-nourished. No distress.  HENT:  Head: Normocephalic and atraumatic.  Mouth/Throat:    No facial swelling appreciated, no trismus noted, oropharynx clear without erythema or edema.No sublingual or submandibular swelling.   Eyes: Conjunctivae are normal.  Cardiovascular: Normal rate.   Pulmonary/Chest: Effort normal.  Abdominal: She exhibits no distension.  Neurological: She is alert.   Skin: Skin is warm and dry.  Psychiatric: She has a normal mood and affect.  Nursing note and vitals reviewed.    ED Treatments / Results  DIAGNOSTIC STUDIES:  Oxygen Saturation is 100% on RA, normal by my interpretation.    COORDINATION OF CARE:  12:30 PM Discussed treatment plan with pt at bedside and pt agreed to plan.   Labs (all labs ordered are listed, but only abnormal results are displayed) Labs Reviewed - No data to display  EKG  EKG Interpretation None       Radiology No results found.  Procedures Procedures (including critical care time)  Medications Ordered in ED Medications  HYDROcodone-acetaminophen (NORCO/VICODIN) 5-325 MG per tablet 1 tablet (1 tablet Oral Given 05/12/16 1247)     Initial Impression / Assessment and Plan / ED Course    I have reviewed the triage vital signs and the nursing notes.  Pertinent labs & imaging results that were available during my care of the patient were reviewed by me and considered in my medical decision making (see chart for details).  Clinical Course   Patient with dentalgia.  No abscess requiring immediate incision and drainage.  Exam not concerning for Ludwig's angina or pharyngeal abscess.  Will treat with clindamycin. Pt instructed to follow-up with dentist.  Discussed return precautions. Pt safe for discharge.    Final Clinical Impressions(s) / ED Diagnoses   Final diagnoses:  Pain, dental    New Prescriptions Discharge Medication List as of 05/12/2016 12:42 PM    START taking these medications   Details  clindamycin (CLEOCIN) 150 MG capsule Take 2 capsules (300 mg total) by mouth 3 (three) times daily., Starting Wed 05/12/2016, Print    naproxen (NAPROSYN) 500 MG tablet Take 1 tablet (500 mg total) by mouth 2 (two) times daily., Starting Wed 05/12/2016, Print      I personally performed the services described in this documentation, which was scribed in my presence. The recorded information has  been reviewed and is accurate.     Rise MuKenneth T Leaphart, PA-C 05/12/16 2354    Alvira MondayErin Schlossman, MD 05/16/16 [phone]

## 2016-05-14 ENCOUNTER — Encounter (HOSPITAL_COMMUNITY): Payer: Self-pay

## 2016-05-14 ENCOUNTER — Emergency Department (HOSPITAL_COMMUNITY): Payer: Self-pay

## 2016-05-14 ENCOUNTER — Emergency Department (HOSPITAL_COMMUNITY)
Admission: EM | Admit: 2016-05-14 | Discharge: 2016-05-14 | Disposition: A | Discharge status: Home / Self Care | Payer: Self-pay | Attending: Emergency Medicine | Admitting: Emergency Medicine

## 2016-05-14 DIAGNOSIS — J029 Acute pharyngitis, unspecified: Secondary | ICD-10-CM | POA: Insufficient documentation

## 2016-05-14 DIAGNOSIS — J45909 Unspecified asthma, uncomplicated: Secondary | ICD-10-CM | POA: Insufficient documentation

## 2016-05-14 DIAGNOSIS — K029 Dental caries, unspecified: Secondary | ICD-10-CM | POA: Insufficient documentation

## 2016-05-14 LAB — I-STAT CHEM 8, ED
BUN: 4 mg/dL — AB (ref 6–20)
CREATININE: 0.8 mg/dL (ref 0.44–1.00)
Calcium, Ion: 0.97 mmol/L — ABNORMAL LOW (ref 1.15–1.40)
Chloride: 106 mmol/L (ref 101–111)
Glucose, Bld: 86 mg/dL (ref 65–99)
HEMATOCRIT: 39 % (ref 36.0–46.0)
Hemoglobin: 13.3 g/dL (ref 12.0–15.0)
Potassium: 4.2 mmol/L (ref 3.5–5.1)
Sodium: 138 mmol/L (ref 135–145)
TCO2: 22 mmol/L (ref 0–100)

## 2016-05-14 MED ORDER — IOPAMIDOL (ISOVUE-300) INJECTION 61%
75.0000 mL | Freq: Once | INTRAVENOUS | Status: AC | PRN
  Administered 2016-05-14: 75 mL via INTRAVENOUS

## 2016-05-14 MED ORDER — MORPHINE SULFATE (PF) 4 MG/ML IV SOLN
4.0000 mg | Freq: Once | INTRAVENOUS | Status: AC
  Administered 2016-05-14: 4 mg via INTRAVENOUS
  Filled 2016-05-14: qty 1

## 2016-05-14 MED ORDER — ONDANSETRON HCL 4 MG/2ML IJ SOLN
4.0000 mg | Freq: Once | INTRAMUSCULAR | Status: AC
  Administered 2016-05-14: 4 mg via INTRAVENOUS
  Filled 2016-05-14: qty 2

## 2016-05-14 MED ORDER — SODIUM CHLORIDE 0.9 % IJ SOLN
INTRAMUSCULAR | Status: AC
  Filled 2016-05-14: qty 50

## 2016-05-14 MED ORDER — DEXAMETHASONE SODIUM PHOSPHATE 10 MG/ML IJ SOLN
10.0000 mg | Freq: Once | INTRAMUSCULAR | Status: AC
  Administered 2016-05-14: 10 mg via INTRAVENOUS
  Filled 2016-05-14: qty 1

## 2016-05-14 MED ORDER — IOPAMIDOL (ISOVUE-300) INJECTION 61%
INTRAVENOUS | Status: AC
  Filled 2016-05-14: qty 75

## 2016-05-14 MED ORDER — HYDROCODONE-ACETAMINOPHEN 7.5-325 MG/15ML PO SOLN: 15.0000 mL | Freq: Four times a day (QID) | ORAL | 0 refills | Status: DC | PRN

## 2016-05-14 NOTE — ED Triage Notes (Signed)
Pt here 2 days ago with what seemed dental pain.  Was given naprosyn.  Pt now with facial swelling on left side and sore throat.  No fever

## 2016-05-14 NOTE — ED Notes (Signed)
Pt mother is en route to pick her up to take her home.

## 2016-05-14 NOTE — ED Provider Notes (Signed)
[facility] DEPT Provider Note   CSN: [id number] Arrival date & time: 05/14/16  1652     History   Chief Complaint Chief Complaint  Patient presents with  . Facial Swelling    HPI Tami Cruz is a 30 y.o. female.  Patient is a 30 year old female with a history of asthma presenting today for worsening left-sided facial pain, swelling and difficulty opening her mouth. Patient was seen 2 days ago and at that time was thought to have a potential dental infection of the left lower wisdom tooth. She was started on clindamycin which she has been taking but states now the pain is an 8 out of 10 and sharp in nature. She has a significant pain when attempting to open her mouth and has a hard time eating. She also notes a hard lump in the left side of her neck with some mild swelling. She denies any shortness of breath or fever. She does note some mild pain in her throat with swallowing. No recent URIs or voice changes   The history is provided by the patient.    Past Medical History:  Diagnosis Date  . Asthma   . Migraines     Patient Active Problem List   Diagnosis Date Noted  . Headache(784.0) 01/26/2013    History reviewed. No pertinent surgical history.  OB History    No data available       Home Medications    Prior to Admission medications   Medication Sig Start Date End Date Taking? Authorizing Provider  clindamycin (CLEOCIN) 150 MG capsule Take 2 capsules (300 mg total) by mouth 3 (three) times daily. Patient taking differently: Take 300 mg by mouth 3 (three) times daily. For 7 days. 05/12/16  Yes Iantha Fallen T Leaphart, PA-C  ECHINACEA EXTRACT PO Take 2 drops by mouth daily. For immune support   Yes Historical Provider, MD  naproxen (NAPROSYN) 500 MG tablet Take 1 tablet (500 mg total) by mouth 2 (two) times daily. 05/12/16  Yes Rise Mu, PA-C    Family History History reviewed. No pertinent family history.  Social History Social History    Substance Use Topics  . Smoking status: Never Smoker  . Smokeless tobacco: Never Used  . Alcohol use Yes     Comment: socially     Allergies   Gluten meal and Penicillins   Review of Systems Review of Systems  All other systems reviewed and are negative.    Physical Exam Updated Vital Signs BP 125/70 (BP Location: Left Arm)   Pulse 76   Temp 99.2 F (37.3 C) (Oral)   Resp 16   Ht 5\' 5"  (1.651 m)   Wt 253 lb 12.8 oz (115.1 kg)   LMP 04/30/2016   SpO2 100%   BMI 42.23 kg/m   Physical Exam  Constitutional: She is oriented to person, place, and time. She appears well-developed and well-nourished. No distress.  HENT:  Head: Normocephalic and atraumatic.  Mouth/Throat: Mucous membranes are normal. No oral lesions. There is trismus in the jaw. Dental caries present. No dental abscesses or uvula swelling. Posterior oropharyngeal erythema present. No oropharyngeal exudate, posterior oropharyngeal edema or tonsillar abscesses. No tonsillar exudate.    Eyes: Conjunctivae and EOM are normal. Pupils are equal, round, and reactive to light.  Neck: Normal range of motion. Neck supple.    Cardiovascular: Normal rate, regular rhythm and intact distal pulses.   No murmur heard. Pulmonary/Chest: Effort normal and breath sounds normal. No respiratory distress. She  has no wheezes. She has no rales.  Abdominal: Soft. She exhibits no distension. There is no tenderness. There is no rebound and no guarding.  Musculoskeletal: Normal range of motion. She exhibits no edema or tenderness.  Neurological: She is alert and oriented to person, place, and time.  Skin: Skin is warm and dry. No rash noted. No erythema.  Psychiatric: She has a normal mood and affect. Her behavior is normal.  Nursing note and vitals reviewed.    ED Treatments / Results  Labs (all labs ordered are listed, but only abnormal results are displayed) Labs Reviewed  I-STAT CHEM 8, ED - Abnormal; Notable for the  following:       Result Value   BUN 4 (*)    Calcium, Ion 0.97 (*)    All other components within normal limits    EKG  EKG Interpretation None       Radiology Ct Soft Tissue Neck W Contrast  Result Date: 05/14/2016 CLINICAL DATA:  Ischial initial evaluation for left-sided facial pain with sore throat. EXAM: CT NECK WITH CONTRAST TECHNIQUE: Multidetector CT imaging of the neck was performed using the standard protocol following the bolus administration of intravenous contrast. CONTRAST:  75mL ISOVUE-300 IOPAMIDOL (ISOVUE-300) INJECTION 61% COMPARISON:  None available. FINDINGS: Pharynx and larynx: Oral cavity within normal limits. No acute abnormality about the dentition. There is mild asymmetric mucosal edema with swelling involving the mucosa of the left oropharynx. Subtle induration within the left parapharyngeal fat. Inflammatory stranding extends anteriorly towards the left submandibular space, with subtle asymmetric thickening of the left platysmas. Nasopharynx itself within normal limits. No significant retropharyngeal soft tissue swelling. No retropharyngeal abscess or collection. The epiglottis itself is within normal limits. Vallecula clear. Additional mild mucosal edema extends inferiorly within the left pharynx/ supraglottic larynx towards the left piriform sinus which is effaced. Findings are suggestive of acute pharyngitis. No abscess or drainable fluid collection identified. Mild attenuation of the supraglottic airway which remains patent at this time. True cords are normal. Subglottic airway is clear. Salivary glands: Small hyperdense lesions within the bilateral parotid glands likely reflect small intra parotid lymph nodes. Parotid glands are otherwise unremarkable. Submandibular glands within normal limits. Thyroid: Thyroid normal. Lymph nodes: Mildly increased number of left-sided subcentimeter cervical lymph nodes as compared to the right, suspected to be reactive in nature. No  pathologically enlarged lymph nodes identified within the neck. Vascular: Normal intravascular enhancement seen throughout the neck. Limited intracranial: Unremarkable. Visualized orbits: Partially visualized globes and orbits within normal limits. Mastoids and visualized paranasal sinuses: Mastoids are clear. Middle ear cavities are well pneumatized. Skeleton: No acute osseous abnormality. No worrisome lytic or blastic osseous lesions. Upper chest: Visualized upper mediastinum without abnormality. Visualized lungs are clear. IMPRESSION: 1. Findings suggestive of acute pharyngitis involving the left pharynx, with subtle swelling and inflammatory stranding within the left parapharyngeal and submandibular spaces. No abscess or drainable fluid collection identified. 2. Mild asymmetric prominence of the left-sided cervical lymph nodes, likely reactive. Electronically Signed   By: Rise MuBenjamin  McClintock M.D.   On: 05/14/2016 20:31    Procedures Procedures (including critical care time)  Medications Ordered in ED Medications  morphine 4 MG/ML injection 4 mg (not administered)  ondansetron (ZOFRAN) injection 4 mg (not administered)  iopamidol (ISOVUE-300) 61 % injection (not administered)  sodium chloride 0.9 % injection (not administered)     Initial Impression / Assessment and Plan / ED Course  I have reviewed the triage vital signs and the nursing notes.  Pertinent labs & imaging results that were available during my care of the patient were reviewed by me and considered in my medical decision making (see chart for details).  Clinical Course     Patient is a 30 year old female returning today with worsening pain in the left side of her lower face and neck. She now has trismus but can open greater than 3 cm. No swelling in the back of her throat or respiratory complaints. She does have a hard nonmobile mass in the left side of her neck which could be a lymph node versus abscess versus inflamed  salivary gland.  She has full range of motion of her neck and no voice changes. Concern for potential developing left with angina. Patient given pain control and CT with contrast pending. 9:21 PM CT showing findings suggestive of acute pharyngitis with sudtle swelling in the left peripharyngeal and submandibular spaces without evidence of abscess or drainable fluid collection. Also reactive lymph nodes. On reevaluation patient has no drooling and is tolerating her secretions well. At this point will have her continue clindamycin which she has only taken 2 doses thus far and given a dose of Decadron.  Instructed to f/u with ENT on Monday for recheck and return to the emergency room if she develops any difficulty with breathing or inability to swallow significant  Final Clinical Impressions(s) / ED Diagnoses   Final diagnoses:  Pharyngitis, unspecified etiology    New Prescriptions New Prescriptions   HYDROCODONE-ACETAMINOPHEN (HYCET) 7.5-325 MG/15 ML SOLUTION    Take 15 mLs by mouth 4 (four) times daily as needed for moderate pain.     Gwyneth SproutWhitney Colleen Donahoe, MD 05/14/16 2125

## 2016-07-14 ENCOUNTER — Emergency Department (HOSPITAL_COMMUNITY): Payer: Self-pay

## 2016-07-14 ENCOUNTER — Emergency Department (HOSPITAL_COMMUNITY)
Admission: EM | Admit: 2016-07-14 | Discharge: 2016-07-14 | Disposition: A | Discharge status: Home / Self Care | Payer: Self-pay | Attending: Emergency Medicine | Admitting: Emergency Medicine

## 2016-07-14 ENCOUNTER — Encounter (HOSPITAL_COMMUNITY): Payer: Self-pay | Admitting: Emergency Medicine

## 2016-07-14 DIAGNOSIS — Y999 Unspecified external cause status: Secondary | ICD-10-CM | POA: Insufficient documentation

## 2016-07-14 DIAGNOSIS — S63502A Unspecified sprain of left wrist, initial encounter: Secondary | ICD-10-CM | POA: Insufficient documentation

## 2016-07-14 DIAGNOSIS — J45909 Unspecified asthma, uncomplicated: Secondary | ICD-10-CM | POA: Insufficient documentation

## 2016-07-14 DIAGNOSIS — X500XXA Overexertion from strenuous movement or load, initial encounter: Secondary | ICD-10-CM | POA: Insufficient documentation

## 2016-07-14 DIAGNOSIS — Y929 Unspecified place or not applicable: Secondary | ICD-10-CM | POA: Insufficient documentation

## 2016-07-14 DIAGNOSIS — Y939 Activity, unspecified: Secondary | ICD-10-CM | POA: Insufficient documentation

## 2016-07-14 MED ORDER — NAPROXEN 500 MG PO TABS: 500.0000 mg | ORAL_TABLET | Freq: Two times a day (BID) | ORAL | 0 refills | Status: DC

## 2016-07-14 MED ORDER — NAPROXEN 500 MG PO TABS
500.0000 mg | ORAL_TABLET | Freq: Once | ORAL | Status: AC
  Administered 2016-07-14: 500 mg via ORAL
  Filled 2016-07-14: qty 1

## 2016-07-14 NOTE — ED Triage Notes (Signed)
Pt reports woke up with left hand pain and numbness. Denies injury nor fall . No redness nor edema noted .

## 2016-07-14 NOTE — ED Notes (Signed)
Pt ambulatory and independent at discharge.  Verbalized understanding of discharge instructions 

## 2016-07-14 NOTE — Discharge Instructions (Signed)
Please read and follow all provided instructions.  Your diagnoses today include:  1. Sprain of left wrist, initial encounter     Tests performed today include: Vital signs. See below for your results today.   Medications prescribed:  Take as prescribed   Home care instructions:  Follow any educational materials contained in this packet.  Follow-up instructions: Please follow-up with your primary care provider for further evaluation of symptoms and treatment   Return instructions:  Please return to the Emergency Department if you do not get better, if you get worse, or new symptoms OR  - Fever (temperature greater than 101.73F)  - Bleeding that does not stop with holding pressure to the area    -Severe pain (please note that you may be more sore the day after your accident)  - Chest Pain  - Difficulty breathing  - Severe nausea or vomiting  - Inability to tolerate food and liquids  - Passing out  - Skin becoming red around your wounds  - Change in mental status (confusion or lethargy)  - New numbness or weakness    Please return if you have any other emergent concerns.  Additional Information:  Your vital signs today were: BP 134/93    Pulse 78    Temp 98.1 F (36.7 C)    Resp 18    Ht 5\' 4"  (1.626 m)    Wt 104.3 kg    LMP 07/07/2016    SpO2 100%    BMI 39.48 kg/m  If your blood pressure (BP) was elevated above 135/85 this visit, please have this repeated by your doctor within one month. ---------------

## 2016-07-14 NOTE — ED Provider Notes (Signed)
[facility]-EMERGENCY DEPT Provider Note   CSN: [id number] Arrival date & time: 07/14/16  1719  By signing my name below, I, Nelwyn Salisbury, attest that this documentation has been prepared under the direction and in the presence of non-physician practitioner, Audry Pili, PA-C. Electronically Signed: Nelwyn Salisbury, Scribe. 07/14/2016. 8:09 PM.  History   Chief Complaint Chief Complaint  Patient presents with  . Hand Pain    left    The history is provided by the patient. No language interpreter was used.    HPI Comments:  Tami Cruz is a 31 y.o. female with no pertinent pmhx who presents to the Emergency Department complaining of sudden-onset, constant, left-hand pain beginning this morning. Pt states she initially thought that she slept on her hand wrong, but her symptoms have persisted through the day. She describes her symptoms as a tingling pain, exacerbated by movement and palpation. Pt has tried ibuprofen at home with minimal relief. She denies any numbness. 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   Past Medical History:  Diagnosis Date  . Asthma   . Migraines     Patient Active Problem List   Diagnosis Date Noted  . Headache(784.0) 01/26/2013    History reviewed. No pertinent surgical history.  OB History    No data available       Home Medications    Prior to Admission medications   Medication Sig Start Date End Date Taking? Authorizing Provider  clindamycin (CLEOCIN) 150 MG capsule Take 2 capsules (300 mg total) by mouth 3 (three) times daily. Patient taking differently: Take 300 mg by mouth 3 (three) times daily. For 7 days. 05/12/16   Rise Mu, PA-C  ECHINACEA EXTRACT PO Take 2 drops by mouth daily. For immune support    Historical Provider, MD  HYDROcodone-acetaminophen (HYCET) 7.5-325 mg/15 ml solution Take 15 mLs by mouth 4 (four) times daily as needed for  moderate pain. 05/14/16 05/14/17  Gwyneth Sprout, MD  naproxen (NAPROSYN) 500 MG tablet Take 1 tablet (500 mg total) by mouth 2 (two) times daily. 05/12/16   Rise Mu, PA-C    Family History No family history on file.  Social History Social History  Substance Use Topics  . Smoking status: Never Smoker  . Smokeless tobacco: Never Used  . Alcohol use Yes     Comment: socially     Allergies   Gluten meal and Penicillins   Review of Systems Review of Systems  Musculoskeletal: Positive for arthralgias.  Neurological: Negative for numbness.   Physical Exam Updated Vital Signs BP 134/93   Pulse 78   Temp 98.1 F (36.7 C)   Resp 18   LMP 07/07/2016   SpO2 100%   Physical Exam  Constitutional: She is oriented to person, place, and time. She appears well-developed and well-nourished. No distress.  HENT:  Head: Normocephalic and atraumatic.  Eyes: Conjunctivae are normal.  Cardiovascular: Normal rate.   Pulmonary/Chest: Effort normal.  Abdominal: She exhibits no distension.  Musculoskeletal: Normal range of motion.  Left wrist pain with range of motion, no swelling noted. Motor sensation intact, grip strength intact, no obvious palpable revisable deformities.   Neurological: She is alert and oriented to person, place, and time.  Skin: Skin is warm and dry.  Psychiatric: She has a normal mood and affect.  Nursing note and vitals reviewed.  ED Treatments / Results  DIAGNOSTIC STUDIES:  Oxygen Saturation  is 100% on RA, normal by my interpretation.    COORDINATION OF CARE:  8:18 PM Discussed treatment plan with pt at bedside which includes wrist brace and pt agreed to plan.  Labs (all labs ordered are listed, but only abnormal results are displayed) Labs Reviewed - No data to display  EKG  EKG Interpretation None       Radiology Dg Hand Complete Left  Result Date: 07/14/2016 CLINICAL DATA:  Acute onset of left hand pain and numbness. Initial  encounter. EXAM: LEFT HAND - COMPLETE 3+ VIEW COMPARISON:  None. FINDINGS: There is no evidence of fracture or dislocation. The joint spaces are preserved. The carpal rows are intact, and demonstrate normal alignment. The soft tissues are unremarkable in appearance. IMPRESSION: No evidence of fracture or dislocation. Electronically Signed   By: Roanna RaiderJeffery  Chang M.D.   On: 07/14/2016 17:55    Procedures Procedures (including critical care time)  Medications Ordered in ED Medications - No data to display   Initial Impression / Assessment and Plan / ED Course  I have reviewed the triage vital signs and the nursing notes.  Pertinent labs & imaging results that were available during my care of the patient were reviewed by me and considered in my medical decision making (see chart for details).  Final Clinical Impressions(s) / ED Diagnoses   {I have reviewed and evaluated the relevant imaging studies.  {I have reviewed the relevant previous healthcare records.  {I obtained HPI from historian.   ED Course:  Assessment: Patient X-Ray negative for obvious fracture or dislocation. Likely sprain. From work. Pt advised to follow up with PCP. Patient given brace while in ED, conservative therapy recommended and discussed. Patient will be discharged home & is agreeable with above plan. Returns precautions discussed. Pt appears safe for discharge.  Disposition/Plan:  DC Home Additional Verbal discharge instructions given and discussed with patient.  Pt Instructed to f/u with PCP in the next week for evaluation and treatment of symptoms. Return precautions given Pt acknowledges and agrees with plan  Supervising Physician No att. providers found  Final diagnoses:  Sprain of left wrist, initial encounter    New Prescriptions New Prescriptions   No medications on file   I personally performed the services described in this documentation, which was scribed in my presence. The recorded information  has been reviewed and is accurate.    Audry Piliyler Korie Streat, PA-C 07/15/16 0004    Canary Brimhristopher J Tegeler, MD 07/15/16 1154

## 2016-07-27 ENCOUNTER — Emergency Department (HOSPITAL_COMMUNITY)
Admission: EM | Admit: 2016-07-27 | Discharge: 2016-07-27 | Disposition: A | Discharge status: Home / Self Care | Payer: Self-pay | Attending: Emergency Medicine | Admitting: Emergency Medicine

## 2016-07-27 ENCOUNTER — Encounter (HOSPITAL_COMMUNITY): Payer: Self-pay | Admitting: Emergency Medicine

## 2016-07-27 DIAGNOSIS — G43009 Migraine without aura, not intractable, without status migrainosus: Secondary | ICD-10-CM | POA: Insufficient documentation

## 2016-07-27 DIAGNOSIS — J45909 Unspecified asthma, uncomplicated: Secondary | ICD-10-CM | POA: Insufficient documentation

## 2016-07-27 MED ORDER — DIPHENHYDRAMINE HCL 50 MG/ML IJ SOLN
25.0000 mg | Freq: Once | INTRAMUSCULAR | Status: AC
  Administered 2016-07-27: 25 mg via INTRAVENOUS
  Filled 2016-07-27: qty 1

## 2016-07-27 MED ORDER — METOCLOPRAMIDE HCL 5 MG/ML IJ SOLN
10.0000 mg | Freq: Once | INTRAMUSCULAR | Status: AC
  Administered 2016-07-27: 10 mg via INTRAVENOUS
  Filled 2016-07-27: qty 2

## 2016-07-27 MED ORDER — SODIUM CHLORIDE 0.9 % IV BOLUS (SEPSIS)
500.0000 mL | Freq: Once | INTRAVENOUS | Status: AC
  Administered 2016-07-27: 500 mL via INTRAVENOUS

## 2016-07-27 NOTE — ED Triage Notes (Signed)
Patient c/o right sided migraine since Sunday. Patient states that light makes pain worse. patient taking ibuprofen without any relief.

## 2016-07-27 NOTE — Discharge Instructions (Signed)
Please read and follow all provided instructions.  Your diagnoses today include:  1. Migraine without aura and without status migrainosus, not intractable     Tests performed today include:  Vital signs. See below for your results today.   Medications:  In the Emergency Department you received:  Reglan - antinausea/headache medication  Benadryl - antihistamine to counteract potential side effects of reglan  Take any prescribed medications only as directed.  Additional information:  Follow any educational materials contained in this packet.  You are having a headache. No specific cause was found today for your headache. It may have been a migraine or other cause of headache. Stress, anxiety, fatigue, and depression are common triggers for headaches.   Your headache today does not appear to be life-threatening or require hospitalization, but often the exact cause of headaches is not determined in the emergency department. Therefore, follow-up with your doctor is very important to find out what may have caused your headache and whether or not you need any further diagnostic testing or treatment.   Sometimes headaches can appear benign (not harmful), but then more serious symptoms can develop which should prompt an immediate re-evaluation by your doctor or the emergency department.  BE VERY CAREFUL not to take multiple medicines containing Tylenol (also called acetaminophen). Doing so can lead to an overdose which can damage your liver and cause liver failure and possibly death.   Follow-up instructions: Please follow-up with your primary care provider in the next 3 days for further evaluation of your symptoms.   Return instructions:   Please return to the Emergency Department if you experience worsening symptoms.  Return if the medications do not resolve your headache, if it recurs, or if you have multiple episodes of vomiting or cannot keep down fluids.  Return if you have a  change from the usual headache.  RETURN IMMEDIATELY IF you:  Develop a sudden, severe headache  Develop confusion or become poorly responsive or faint  Develop a fever above 100.81F or problem breathing  Have a change in speech, vision, swallowing, or understanding  Develop new weakness, numbness, tingling, incoordination in your arms or legs  Have a seizure  Please return if you have any other emergent concerns.  Additional Information:  Your vital signs today were: BP 125/90 (BP Location: Left Arm)    Pulse 72    Temp 98.2 F (36.8 C) (Oral)    Resp 18    LMP 07/07/2016    SpO2 100%  If your blood pressure (BP) was elevated above 135/85 this visit, please have this repeated by your doctor within one month. --------------

## 2016-07-27 NOTE — ED Provider Notes (Signed)
[facility]EMERGENCY DEPT Provider Note   CSN: [id number] Arrival date & time: 07/27/16  0908     History   Chief Complaint Chief Complaint  Patient presents with  . Migraine    HPI Tami Cruz is a 31 y.o. female.  Patient presents with complaint of migraine headache starting 2 days ago. Patient describes right-sided throbbing pain with associated photophobia and phonophobia. No fevers, neck pain or head injuries. Patient states that the pain is similar to previous headaches. She has been seen in emergency department for this in the past. No weakness, numbness, or tingling in her arms or her legs. She has tried ibuprofen without relief. The onset of this condition was acute. The course is constant.        Past Medical History:  Diagnosis Date  . Asthma   . Migraines     Patient Active Problem List   Diagnosis Date Noted  . Headache(784.0) 01/26/2013    History reviewed. No pertinent surgical history.  OB History    No data available       Home Medications    Prior to Admission medications   Medication Sig Start Date End Date Taking? Authorizing Provider  clindamycin (CLEOCIN) 150 MG capsule Take 2 capsules (300 mg total) by mouth 3 (three) times daily. Patient taking differently: Take 300 mg by mouth 3 (three) times daily. For 7 days. 05/12/16   Rise MuKenneth T Leaphart, PA-C  ECHINACEA EXTRACT PO Take 2 drops by mouth daily. For immune support    Historical Provider, MD  HYDROcodone-acetaminophen (HYCET) 7.5-325 mg/15 ml solution Take 15 mLs by mouth 4 (four) times daily as needed for moderate pain. 05/14/16 05/14/17  Gwyneth SproutWhitney Plunkett, MD  naproxen (NAPROSYN) 500 MG tablet Take 1 tablet (500 mg total) by mouth 2 (two) times daily. 07/14/16   Audry Piliyler Mohr, PA-C    Family History No family history on file.  Social History Social History  Substance Use Topics  . Smoking status: Never Smoker  . Smokeless tobacco: Never Used  . Alcohol use Yes     Comment:  socially     Allergies   Gluten meal and Penicillins   Review of Systems Review of Systems  Constitutional: Negative for fever.  HENT: Negative for congestion, dental problem, rhinorrhea and sinus pressure.   Eyes: Positive for photophobia. Negative for discharge, redness and visual disturbance.  Respiratory: Negative for shortness of breath.   Cardiovascular: Negative for chest pain.  Gastrointestinal: Negative for nausea and vomiting.  Musculoskeletal: Negative for gait problem, neck pain and neck stiffness.  Skin: Negative for rash.  Neurological: Positive for headaches. Negative for syncope, speech difficulty, weakness, light-headedness and numbness.  Psychiatric/Behavioral: Negative for confusion.     Physical Exam Updated Vital Signs BP 125/90 (BP Location: Left Arm)   Pulse 72   Temp 98.2 F (36.8 C) (Oral)   Resp 18   LMP 07/07/2016   SpO2 100%   Physical Exam  Constitutional: She is oriented to person, place, and time. She appears well-developed and well-nourished.  HENT:  Head: Normocephalic and atraumatic.  Right Ear: Tympanic membrane, external ear and ear canal normal.  Left Ear: Tympanic membrane, external ear and ear canal normal.  Nose: Nose normal.  Mouth/Throat: Uvula is midline, oropharynx is clear and moist and mucous membranes are normal.  Eyes: Conjunctivae, EOM and lids are normal. Pupils are equal, round, and reactive to light. Right eye exhibits no nystagmus. Left eye exhibits no nystagmus.  Neck: Normal range of motion.  Neck supple.  Cardiovascular: Normal rate and regular rhythm.   Pulmonary/Chest: Effort normal and breath sounds normal.  Abdominal: Soft. There is no tenderness.  Musculoskeletal:       Cervical back: She exhibits normal range of motion, no tenderness and no bony tenderness.  Neurological: She is alert and oriented to person, place, and time. She has normal strength and normal reflexes. No cranial nerve deficit or sensory  deficit. She displays a negative Romberg sign. Coordination and gait normal. GCS eye subscore is 4. GCS verbal subscore is 5. GCS motor subscore is 6.  Skin: Skin is warm and dry.  Psychiatric: She has a normal mood and affect.  Nursing note and vitals reviewed.    ED Treatments / Results   Procedures Procedures (including critical care time)  Medications Ordered in ED Medications  metoCLOPramide (REGLAN) injection 10 mg (10 mg Intravenous Given 07/27/16 0957)  diphenhydrAMINE (BENADRYL) injection 25 mg (25 mg Intravenous Given 07/27/16 0956)  sodium chloride 0.9 % bolus 500 mL (500 mLs Intravenous New Bag/Given 07/27/16 0957)     Initial Impression / Assessment and Plan / ED Course  I have reviewed the triage vital signs and the nursing notes.  Pertinent labs & imaging results that were available during my care of the patient were reviewed by me and considered in my medical decision making (see chart for details).     Patient seen and examined. Medications ordered.   Vital signs reviewed and are as follows: BP 125/90 (BP Location: Left Arm)   Pulse 72   Temp 98.2 F (36.8 C) (Oral)   Resp 18   LMP 07/07/2016   SpO2 100%   11:30 AM patient feeling better after treatment. She states that she is ready for discharge to home. Patient encouraged to rest today.  Patient urged to return with worsening symptoms or other concerns. Patient verbalized understanding and agrees with plan.    Final Clinical Impressions(s) / ED Diagnoses   Final diagnoses:  Migraine without aura and without status migrainosus, not intractable   Patient with her typical migraine sx. Patient without high-risk features of headache including: sudden onset/thunderclap HA, no similar headache in past, altered mental status, accompanying seizure, headache with exertion, age > 32, history of immunocompromise, neck or shoulder pain, fever, use of anticoagulation, family history of spontaneous SAH, concomitant  drug use, toxic exposure.   Patient has a normal complete neurological exam, normal vital signs, normal level of consciousness, no signs of meningismus, is well-appearing/non-toxic appearing, no signs of trauma.   Imaging with CT/MRI not indicated given history and physical exam findings.   No dangerous or life-threatening conditions suspected or identified by history, physical exam, and by work-up. No indications for hospitalization identified.    New Prescriptions New Prescriptions   No medications on file     Renne Crigler, PA-C 07/27/16 [address] [place], [place] 07/27/16 [phone]

## 2016-09-05 IMAGING — CR DG CHEST 2V
2 series · 2 of 2 positions shown · non-contrast
Comparison: None.

CLINICAL DATA: Cough.

EXAM:
CHEST  2 VIEW

[w chest pa]
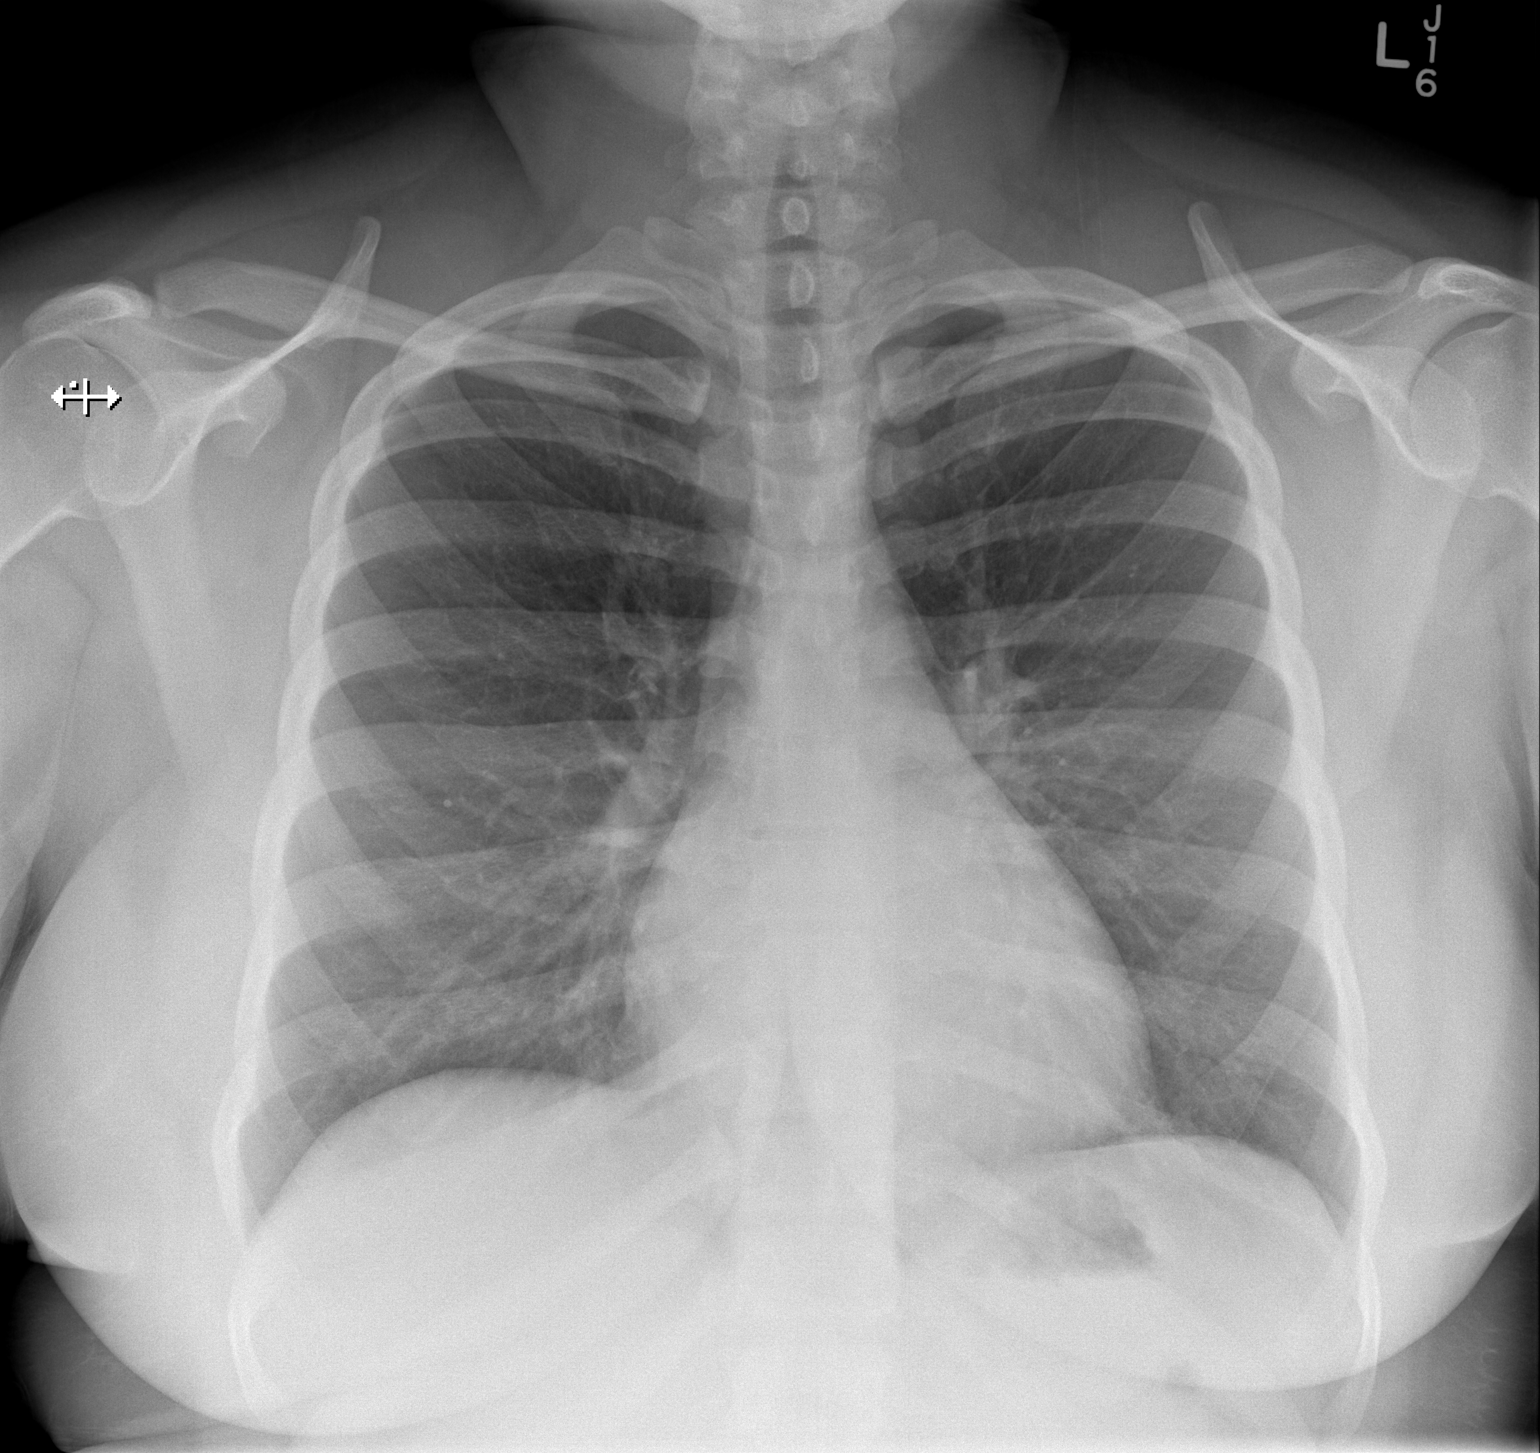

[w chest lat]
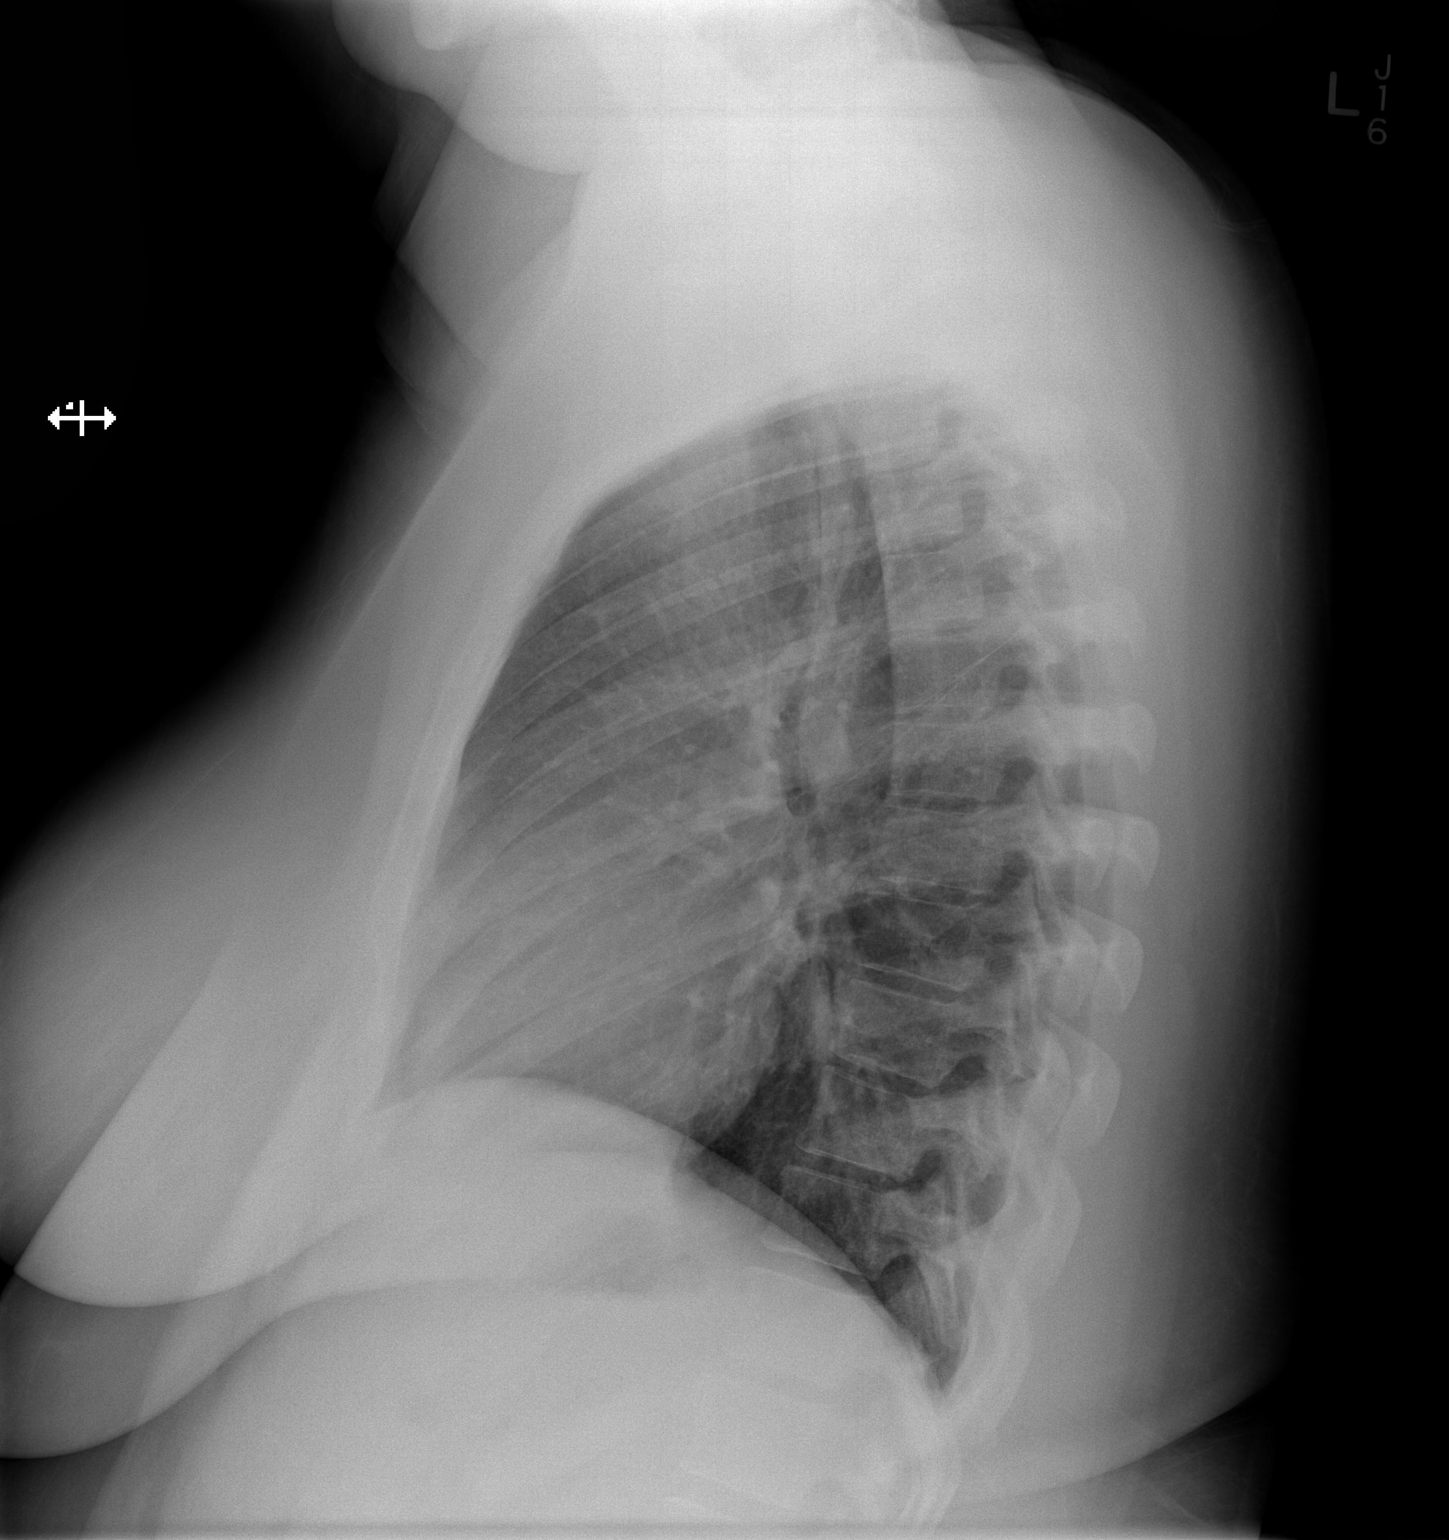

[2 of 2 positions shown; findings below may reference images not displayed]

FINDINGS: Mediastinum and hilar structures normal. Lungs are clear. Heart size
normal. No pleural effusion or pneumothorax. No acute bony
abnormality .
IMPRESSION: New no acute cardiopulmonary disease.

## 2016-10-01 ENCOUNTER — Emergency Department (HOSPITAL_COMMUNITY)
Admission: EM | Admit: 2016-10-01 | Discharge: 2016-10-01 | Disposition: A | Discharge status: Home / Self Care | Payer: Self-pay | Attending: Emergency Medicine | Admitting: Emergency Medicine

## 2016-10-01 ENCOUNTER — Encounter (HOSPITAL_COMMUNITY): Payer: Self-pay | Admitting: Emergency Medicine

## 2016-10-01 DIAGNOSIS — L298 Other pruritus: Secondary | ICD-10-CM | POA: Insufficient documentation

## 2016-10-01 DIAGNOSIS — L282 Other prurigo: Secondary | ICD-10-CM

## 2016-10-01 DIAGNOSIS — Z79899 Other long term (current) drug therapy: Secondary | ICD-10-CM | POA: Insufficient documentation

## 2016-10-01 DIAGNOSIS — J45909 Unspecified asthma, uncomplicated: Secondary | ICD-10-CM | POA: Insufficient documentation

## 2016-10-01 MED ORDER — LORATADINE 10 MG PO TABS: 10.0000 mg | ORAL_TABLET | Freq: Every day | ORAL | 0 refills | Status: AC

## 2016-10-01 MED ORDER — HYDROXYZINE HCL 25 MG PO TABS: 25.0000 mg | ORAL_TABLET | Freq: Three times a day (TID) | ORAL | 0 refills | Status: DC | PRN

## 2016-10-01 MED ORDER — LORATADINE 10 MG PO TABS: 10.0000 mg | ORAL_TABLET | Freq: Every day | ORAL | 0 refills | Status: DC

## 2016-10-01 MED ORDER — HYDROXYZINE HCL 25 MG PO TABS: 25.0000 mg | ORAL_TABLET | Freq: Three times a day (TID) | ORAL | 0 refills | Status: AC | PRN

## 2016-10-01 NOTE — Discharge Instructions (Signed)
Read the information below.  Use the prescribed medication as directed.  Please discuss all new medications with your pharmacist.  You may return to the Emergency Department at any time for worsening condition or any new symptoms that concern you.   If you develop high fevers, difficulty swallowing or breathing, or you are unable to tolerate fluids by mouth, return to the ER immediately for a recheck.    °

## 2016-10-01 NOTE — ED Notes (Signed)
Itching all over and has mild rash on face , states that she has not used any new face creams, lotions or detergents, has been going on since sunday

## 2016-10-01 NOTE — ED Triage Notes (Signed)
Pt reports generalized itching since Monday. Thinks she may be allergic to eggs. Taking benedryl at home. No SOB or throat swelling

## 2016-10-01 NOTE — ED Notes (Signed)
Bed: WA11 Expected date:  Expected time:  Means of arrival:  Comments: EMS-hypotensive 

## 2016-10-01 NOTE — ED Provider Notes (Signed)
[facility]PT Provider Note   CSN: [id number] Arrival date & time: 10/01/16  0915  By signing my name below, I, Rosario AdieWilliam Andrew Hiatt, attest that this documentation has been prepared under the direction and in the presence of [facility] Baillie Mohammad, PA-C.  Electronically Signed: Rosario AdieWilliam Andrew Hiatt, ED Scribe. 10/01/16. 10:23 AM.  History   Chief Complaint Chief Complaint  Patient presents with  . Pruritis   The history is provided by the patient. No language interpreter was used.    HPI Comments: Tami Cruz is a 31 y.o. female with no pertinent PMHx, who presents to the Emergency Department complaining of intermittent sensation of generalized pruritis to her face, arms, and posterior legs beginning five days ago. She notes that there have been some small "bumps" to her areas of pruritis since the onset of her symptoms. No recent new soaps, lotions, detergents, animals, plants, or medications; however, she notes that recently she has been eating more eggs than normal. No recent new clothing, sleeping arrangements. Pt has been taking Benadryl with some temporary relief of this sensation. She denies nausea, vomiting, diarrhea, sensation of tongue/throat swelling, sore throat, trouble swallowing, shortness of breath, or any other associated symptoms.   Past Medical History:  Diagnosis Date  . Asthma   . Migraines    Patient Active Problem List   Diagnosis Date Noted  . Headache(784.0) 01/26/2013   History reviewed. No pertinent surgical history.  OB History    No data available     Home Medications    Prior to Admission medications   Medication Sig Start Date End Date Taking? Authorizing Provider  ECHINACEA EXTRACT PO Take 2 drops by mouth daily. For immune support    Historical Provider, MD  hydrOXYzine (ATARAX/VISTARIL) 25 MG tablet Take 1 tablet (25 mg total) by mouth every 8 (eight) hours as needed for itching. 10/01/16   Trixie DredgeEmily Ameliana Brashear, PA-C  ibuprofen (ADVIL,MOTRIN) 200  MG tablet Take 400 mg by mouth every 6 (six) hours as needed for headache.    Historical Provider, MD  loratadine (CLARITIN) 10 MG tablet Take 1 tablet (10 mg total) by mouth daily. 10/01/16   Trixie DredgeEmily Zyaira Vejar, PA-C  Multiple Vitamin (MULTIVITAMIN WITH MINERALS) TABS tablet Take 1 tablet by mouth daily.    Historical Provider, MD  naproxen (NAPROSYN) 500 MG tablet Take 1 tablet (500 mg total) by mouth 2 (two) times daily. 07/14/16   Audry Piliyler Mohr, PA-C   Family History History reviewed. No pertinent family history.  Social History Social History  Substance Use Topics  . Smoking status: Never Smoker  . Smokeless tobacco: Never Used  . Alcohol use Yes     Comment: socially   Allergies   Gluten meal and Penicillins  Review of Systems Review of Systems  HENT: Negative for sore throat and trouble swallowing.   Respiratory: Negative for shortness of breath.   Gastrointestinal: Negative for diarrhea, nausea and vomiting.  Skin: Positive for rash.       Positive for pruritis.    Physical Exam Updated Vital Signs BP 138/82 (BP Location: Right Wrist)   Pulse 78   Temp 97.4 F (36.3 C) (Oral)   Resp 17   Ht 5\' 5"  (1.651 m)   Wt 102.1 kg   SpO2 100%   BMI 37.44 kg/m   Physical Exam  Constitutional: She appears well-developed and well-nourished. No distress.  HENT:  Head: Normocephalic and atraumatic.  Mouth/Throat: Uvula is midline, oropharynx is clear and moist and mucous membranes are normal.  No oropharyngeal exudate, posterior oropharyngeal edema, posterior oropharyngeal erythema or tonsillar abscesses.  Eyes: Conjunctivae are normal.  Neck: Normal range of motion. Neck supple.  Cardiovascular: Normal rate and regular rhythm.   Pulmonary/Chest: Effort normal and breath sounds normal. No stridor. No respiratory distress. She has no wheezes. She has no rales.  Lymphadenopathy:    She has no cervical adenopathy.  Neurological: She is alert.  Skin: She is not diaphoretic.  Fine skin  colored papules over the face.   Nursing note and vitals reviewed.  ED Treatments / Results  DIAGNOSTIC STUDIES: Oxygen Saturation is 100% on RA, normal by my interpretation.   COORDINATION OF CARE: 10:23 AM-Discussed next steps with pt. Pt verbalized understanding and is agreeable with the plan.   Labs (all labs ordered are listed, but only abnormal results are displayed) Labs Reviewed - No data to display  EKG  EKG Interpretation None      Radiology No results found.  Procedures Procedures   Medications Ordered in ED Medications - No data to display  Initial Impression / Assessment and Plan / ED Course  I have reviewed the triage vital signs and the nursing notes.  Pertinent labs & imaging results that were available during my care of the patient were reviewed by me and considered in my medical decision making (see chart for details).      Pt with unspecified etiology of fine papular skin-colored rash and sensation of pruritis. Patient denies any difficulty breathing or swallowing. Pt has a patent airway without stridor and is handling secretions without difficulty; no angioedema. No blisters, no pustules, no warmth, no draining sinus tracts, no superficial abscesses, no bullous impetigo, no vesicles, no desquamation, no target lesions with dusky purpura or a central bulla. Not tender to touch. No concern for superimposed infection. No concern for SJS, TEN, TSS, tick borne illness, syphilis or other life-threatening condition. Will discharge home with Claritin and Visceral and referral for f/u w/ allergen specialist.   Final Clinical Impressions(s) / ED Diagnoses   Final diagnoses:  Pruritic rash   New Prescriptions Discharge Medication List as of 10/01/2016 10:28 AM    START taking these medications   Details  hydrOXYzine (ATARAX/VISTARIL) 25 MG tablet Take 1 tablet (25 mg total) by mouth every 8 (eight) hours as needed for itching., Starting Fri 10/01/2016, Print     loratadine (CLARITIN) 10 MG tablet Take 1 tablet (10 mg total) by mouth daily., Starting Fri 10/01/2016, Print      I personally performed the services described in this documentation, which was scribed in my presence. The recorded information has been reviewed and is accurate.     Trixie Dredge, PA-C 10/01/16 Ninfa Linden    Shaune Pollack, MD 10/02/16 1130

## 2016-11-01 ENCOUNTER — Encounter (HOSPITAL_COMMUNITY): Payer: Self-pay | Admitting: Emergency Medicine

## 2016-11-01 ENCOUNTER — Emergency Department (HOSPITAL_COMMUNITY)
Admission: EM | Admit: 2016-11-01 | Discharge: 2016-11-01 | Disposition: A | Discharge status: Home / Self Care | Payer: Self-pay | Attending: Emergency Medicine | Admitting: Emergency Medicine

## 2016-11-01 DIAGNOSIS — J45909 Unspecified asthma, uncomplicated: Secondary | ICD-10-CM | POA: Insufficient documentation

## 2016-11-01 DIAGNOSIS — K0889 Other specified disorders of teeth and supporting structures: Secondary | ICD-10-CM | POA: Insufficient documentation

## 2016-11-01 DIAGNOSIS — Z79899 Other long term (current) drug therapy: Secondary | ICD-10-CM | POA: Insufficient documentation

## 2016-11-01 MED ORDER — CLINDAMYCIN HCL 300 MG PO CAPS: 300.0000 mg | ORAL_CAPSULE | Freq: Four times a day (QID) | ORAL | 0 refills | Status: DC

## 2016-11-01 MED ORDER — HYDROCODONE-ACETAMINOPHEN 5-325 MG PO TABS: 1.0000 | ORAL_TABLET | Freq: Four times a day (QID) | ORAL | 0 refills | Status: DC | PRN

## 2016-11-01 NOTE — ED Provider Notes (Signed)
[facility]PT Provider Note   CSN: [id number] Arrival date & time: 11/01/16  2130     History   Chief Complaint Chief Complaint  Patient presents with  . Dental Pain    HPI Tami Cruz is a 31 y.o. female.  Patient complains of pain in her right lower tooth for several days. She has taken ibuprofen with minimal success. No fever, sweats, chills, stiff neck, neurodeficits. Severity of pain is moderate.      Past Medical History:  Diagnosis Date  . Asthma   . Migraines     Patient Active Problem List   Diagnosis Date Noted  . Headache(784.0) 01/26/2013    History reviewed. No pertinent surgical history.  OB History    No data available       Home Medications    Prior to Admission medications   Medication Sig Start Date End Date Taking? Authorizing Provider  clindamycin (CLEOCIN) 300 MG capsule Take 1 capsule (300 mg total) by mouth 4 (four) times daily. 11/01/16   Donnetta Hutching, MD  [facility] EXTRACT PO Take 2 drops by mouth daily. For immune support    [provider]  HYDROcodone-acetaminophen (NORCO/VICODIN) 5-325 MG tablet Take 1-2 tablets by mouth every 6 (six) hours as needed. 11/01/16   Donnetta Hutching, MD  hydrOXYzine (ATARAX/VISTARIL) 25 MG tablet Take 1 tablet (25 mg total) by mouth every 8 (eight) hours as needed for itching. 10/01/16   Trixie Dredge, PA-C  ibuprofen (ADVIL,MOTRIN) 200 MG tablet Take 400 mg by mouth every 6 (six) hours as needed for headache.    [provider]  loratadine (CLARITIN) 10 MG tablet Take 1 tablet (10 mg total) by mouth daily. 10/01/16   Trixie Dredge, PA-C  Multiple Vitamin (MULTIVITAMIN WITH MINERALS) TABS tablet Take 1 tablet by mouth daily.    [provider]  naproxen (NAPROSYN) 500 MG tablet Take 1 tablet (500 mg total) by mouth 2 (two) times daily. 07/14/16   Audry Pili, PA-C    Family History History reviewed. No pertinent family history.  Social History Social History  Substance Use  Topics  . Smoking status: Never Smoker  . Smokeless tobacco: Never Used  . Alcohol use Yes     Comment: socially     Allergies   Gluten meal and Penicillins   Review of Systems Review of Systems  All other systems reviewed and are negative.    Physical Exam Updated Vital Signs BP (!) 136/91 (BP Location: Left Arm)   Pulse 80   Resp 16   Ht 5\' 5"  (1.651 m)   Wt 102.1 kg (225 lb)   LMP 10/18/2016   SpO2 100%   BMI 37.44 kg/m   Physical Exam  Constitutional: She is oriented to person, place, and time. She appears well-developed and well-nourished.  HENT:  Head: Normocephalic and atraumatic.  Tooth #32:  Tender at the gingival base with caries  Eyes: Conjunctivae are normal.  Neck: Neck supple.  Musculoskeletal: Normal range of motion.  Neurological: She is alert and oriented to person, place, and time.  Skin: Skin is warm and dry.  Psychiatric: She has a normal mood and affect. Her behavior is normal.  Nursing note and vitals reviewed.    ED Treatments / Results  Labs (all labs ordered are listed, but only abnormal results are displayed) Labs Reviewed - No data to display  EKG  EKG Interpretation None       Radiology No results found.  Procedures Procedures (including critical  care time)  Medications Ordered in ED Medications - No data to display   Initial Impression / Assessment and Plan / ED Course  I have reviewed the triage vital signs and the nursing notes.  Pertinent labs & imaging results that were available during my care of the patient were reviewed by me and considered in my medical decision making (see chart for details).     Will Rx clindamycin 300 mg and Percocet. Follow-up with dentist on call.  Final Clinical Impressions(s) / ED Diagnoses   Final diagnoses:  Pain, dental    New Prescriptions Discharge Medication List as of 11/01/2016  7:54 AM    START taking these medications   Details  clindamycin (CLEOCIN) 300 MG  capsule Take 1 capsule (300 mg total) by mouth 4 (four) times daily., Starting Mon 11/01/2016, Print    HYDROcodone-acetaminophen (NORCO/VICODIN) 5-325 MG tablet Take 1-2 tablets by mouth every 6 (six) hours as needed., Starting Mon 11/01/2016, Print         Donnetta Hutchingook, [place] Virrueta, MD 11/01/16 [phone]

## 2016-11-01 NOTE — ED Notes (Signed)
Bed: WA25 Expected date:  Expected time:  Means of arrival:  Comments: 

## 2016-11-01 NOTE — Discharge Instructions (Signed)
Follow-up Dr Lexine BatonHisaw, [address], [place], [place] [address].  [phone].   Prescription for antibiotic and pain medicine. Can also take ibuprofen 4 tablets 3 times a day (over-the-counter)

## 2016-11-01 NOTE — ED Notes (Signed)
Dr Cook at bedside

## 2016-11-01 NOTE — ED Notes (Signed)
Patient is alert and oriented x3.  She was given DC instructions and follow up visit instructions.  Patient gave verbal understanding. She was DC ambulatory under her own power to home.  V/S stable.  He was not showing any signs of distress on DC 

## 2016-11-01 NOTE — ED Triage Notes (Signed)
Pt reports right lower dental pain that started 2 days ago. Pt reports minor relief with Motrin. Pt states pain is throbbing in nature.

## 2016-11-09 ENCOUNTER — Encounter (HOSPITAL_COMMUNITY): Payer: Self-pay

## 2016-11-09 ENCOUNTER — Emergency Department (HOSPITAL_COMMUNITY)
Admission: EM | Admit: 2016-11-09 | Discharge: 2016-11-09 | Disposition: A | Discharge status: Home / Self Care | Payer: Self-pay | Attending: Emergency Medicine | Admitting: Emergency Medicine

## 2016-11-09 DIAGNOSIS — J45909 Unspecified asthma, uncomplicated: Secondary | ICD-10-CM | POA: Insufficient documentation

## 2016-11-09 DIAGNOSIS — Z79899 Other long term (current) drug therapy: Secondary | ICD-10-CM | POA: Insufficient documentation

## 2016-11-09 DIAGNOSIS — K047 Periapical abscess without sinus: Secondary | ICD-10-CM | POA: Insufficient documentation

## 2016-11-09 MED ORDER — HYDROCODONE-ACETAMINOPHEN 5-325 MG PO TABS: 1.0000 | ORAL_TABLET | ORAL | 0 refills | Status: DC | PRN

## 2016-11-09 MED ORDER — CLINDAMYCIN HCL 150 MG PO CAPS: 300.0000 mg | ORAL_CAPSULE | Freq: Three times a day (TID) | ORAL | 0 refills | Status: DC

## 2016-11-09 NOTE — ED Triage Notes (Signed)
Patient presents with right lower dental pain. [facility] week and was sent to dentist on call for extraction. Patient reports having her 2 right lower molars removed on 11/01/16. Patient states since the extraction, she has been experiencing pain and "a bad taste in my mouth." Patient denies fever/chills/diarrhea. Patient reports being able to chew and swallow without difficulty. Patient reports taking the last of her prescribed vicoden on Friday 6/8. Patient reports since Friday, shes been taking over the counter Ibuprofen 800mg  PRN for pain- last dose was this AM at approx 1000.

## 2016-11-09 NOTE — ED Provider Notes (Signed)
[facility]-EMERGENCY DEPT Provider Note   CSN: [id number] Arrival date & time: 11/09/16  1622  By signing my name below, I, Diona BrownerJennifer Gorman, attest that this documentation has been prepared under the direction and in the presence of Melburn HakeNicole Nadeau, PA-C. Electronically Signed: Diona BrownerJennifer Gorman, ED Scribe. 11/09/16. 4:55 PM.  History   Chief Complaint Chief Complaint  Patient presents with  . Dental Pain    HPI Tami Cruz is a 31 y.o. female who presents to the Emergency Department complaining of throbbing right lower dental pain since Thursday, 11/04/16, worsening today with a sour taste in her mouth. Pt is concerned of a possible infection. [facility] and was sent to the on call dentist for extraction. She had her 2 right lower molars removed on 11/01/16. She finished her prescribed antibiotic (clindamycin) on Saturday, 11/06/16. Pt also notes temporary, mild, swelling to the right side of her neck/ jaw. Pt is able to chew and swallow without difficulty. She has since been taking OTC 800 mg ibuprofen with the last dose ~ 10 am, denies relief. Pt denies fever, chills, diarrhea, vomiting, trouble breathing, and trouble swallowing. Patient reports trying to contact her dentist office today but notes their office was closed and she was unable to speak with anyone in the clinic.  The history is provided by the patient. No language interpreter was used.    Past Medical History:  Diagnosis Date  . Asthma   . Migraines     Patient Active Problem List   Diagnosis Date Noted  . Headache(784.0) 01/26/2013    History reviewed. No pertinent surgical history.  OB History    No data available       Home Medications    Prior to Admission medications   Medication Sig Start Date End Date Taking? Authorizing Provider  clindamycin (CLEOCIN) 150 MG capsule Take 2 capsules (300 mg total) by mouth 3 (three) times daily. May dispense as 150mg  capsules 11/09/16   Barrett HenleNadeau, Nicole  Elizabeth, PA-C  ECHINACEA EXTRACT PO Take 2 drops by mouth daily. For immune support    [provider]  HYDROcodone-acetaminophen (NORCO/VICODIN) 5-325 MG tablet Take 1 tablet by mouth every 4 (four) hours as needed. 11/09/16   Barrett HenleNadeau, Nicole Elizabeth, PA-C  hydrOXYzine (ATARAX/VISTARIL) 25 MG tablet Take 1 tablet (25 mg total) by mouth every 8 (eight) hours as needed for itching. 10/01/16   Trixie DredgeWest, Emily, PA-C  ibuprofen (ADVIL,MOTRIN) 200 MG tablet Take 400 mg by mouth every 6 (six) hours as needed for headache.    [provider]  loratadine (CLARITIN) 10 MG tablet Take 1 tablet (10 mg total) by mouth daily. 10/01/16   Trixie DredgeWest, Emily, PA-C  Multiple Vitamin (MULTIVITAMIN WITH MINERALS) TABS tablet Take 1 tablet by mouth daily.    [provider]  naproxen (NAPROSYN) 500 MG tablet Take 1 tablet (500 mg total) by mouth 2 (two) times daily. 07/14/16   Audry PiliMohr, Tyler, PA-C    Family History History reviewed. No pertinent family history.  Social History Social History  Substance Use Topics  . Smoking status: Never Smoker  . Smokeless tobacco: Never Used  . Alcohol use Yes     Comment: socially     Allergies   Gluten meal and Penicillins   Review of Systems Review of Systems  Constitutional: Negative for chills and fever.  HENT: Positive for dental problem. Negative for trouble swallowing.   Respiratory: Negative for shortness of breath.   Gastrointestinal: Negative for  diarrhea and vomiting.     Physical Exam Updated Vital Signs Ht 5\' 5"  (1.651 m)   Wt 102.1 kg (225 lb)   LMP 10/18/2016   SpO2 100%   BMI 37.44 kg/m   Physical Exam  Constitutional: She is oriented to person, place, and time. She appears well-developed and well-nourished.  HENT:  Head: Normocephalic and atraumatic.  Mouth/Throat:    Poor dentition throughout with multiple dental carries. No trismus, drooling, facial/neck swelling or stridor on exam. No muffled voice. Floor of mouth  soft.  No facial or neck swelling.  Eyes: Conjunctivae and EOM are normal. Right eye exhibits no discharge. Left eye exhibits no discharge. No scleral icterus.  Neck: Normal range of motion. Neck supple.  Cardiovascular: Normal rate, regular rhythm, normal heart sounds and intact distal pulses.   Pulmonary/Chest: Effort normal and breath sounds normal.  Abdominal: Soft. She exhibits no distension.  Musculoskeletal: She exhibits no edema.  Neurological: She is alert and oriented to person, place, and time.  Skin: Skin is warm and dry.  Nursing note and vitals reviewed.    ED Treatments / Results  DIAGNOSTIC STUDIES: Oxygen Saturation is 100% on RA, normal by my interpretation.   COORDINATION OF CARE: 4:55 PM-Discussed next steps with pt which includes taking clindamycin and following up with her dentist. Pt verbalized understanding and is agreeable with the plan.   Labs (all labs ordered are listed, but only abnormal results are displayed) Labs Reviewed - No data to display  EKG  EKG Interpretation None       Radiology No results found.  Procedures Procedures (including critical care time)  Medications Ordered in ED Medications - No data to display   Initial Impression / Assessment and Plan / ED Course  I have reviewed the triage vital signs and the nursing notes.  Pertinent labs & imaging results that were available during my care of the patient were reviewed by me and considered in my medical decision making (see chart for details).     Patient presents with worsening dental pain that started today. She reports having to right lower molars extracted on 11/01/16 by her dentist but notes she began having worsening pain and tasting sour drainage in her mouth today. She states she is unable to contact anyone in her dentist clinic as it was closed. Denies fever. Reports finishing her prescription of clindamycin 3 days ago. VSS. Exam revealed extracted teeth # 30 & 31 with  well healing incision site, only small amount of erythema and tenderness present to surrounding gingiva. No swelling, induration, fluctuance or drainage. Impaction of right lower wisdom tooth present with mild tenderness to palpation. Remaining exam unremarkable. I discussed with patient that she would need to follow up with her dentist for further pain management regarding her recent teeth extractions. I advised that I would discharge her home with a prescription for Norco #4 7 that she could have some relief of pain until she is able to follow up with her dentist tomorrow morning. I will also restart patient on clindamycin due to concern for possible infection. Discussed plan with patient who is in agreement.  Final Clinical Impressions(s) / ED Diagnoses   Final diagnoses:  Dental infection    New Prescriptions Discharge Medication List as of 11/09/2016  5:04 PM     I personally performed the services described in this documentation, which was scribed in my presence. The recorded information has been reviewed and is accurate.     Melburn Hake  Kemp, PA-C 11/09/16 1742    Mancel Bale, MD 11/11/16 1321

## 2016-11-09 NOTE — Discharge Instructions (Signed)
Take your medications as prescribed. You may also take ibuprofen 600 mg every 6 hours as seen for additional relief or apply ice to affected area. You need to call your dentist office tomorrow morning for follow-up evaluation and further management of your dental pain which is being managed by her dentist. Please return to the Emergency Department if symptoms worsen or new onset of fever, facial swelling, unable to open jaw completely, difficulty swallowing resulting in drooling, difficulty breathing, vomiting.

## 2016-11-21 ENCOUNTER — Emergency Department (HOSPITAL_COMMUNITY)
Admission: EM | Admit: 2016-11-21 | Discharge: 2016-11-21 | Disposition: A | Discharge status: Home / Self Care | Payer: Self-pay | Attending: Emergency Medicine | Admitting: Emergency Medicine

## 2016-11-21 ENCOUNTER — Encounter (HOSPITAL_COMMUNITY): Payer: Self-pay | Admitting: *Deleted

## 2016-11-21 DIAGNOSIS — J45909 Unspecified asthma, uncomplicated: Secondary | ICD-10-CM | POA: Insufficient documentation

## 2016-11-21 DIAGNOSIS — J320 Chronic maxillary sinusitis: Secondary | ICD-10-CM | POA: Insufficient documentation

## 2016-11-21 DIAGNOSIS — Z79899 Other long term (current) drug therapy: Secondary | ICD-10-CM | POA: Insufficient documentation

## 2016-11-21 MED ORDER — SODIUM CHLORIDE-SODIUM BICARB 2300-700 MG NA KIT: PACK | NASAL | 0 refills | Status: AC

## 2016-11-21 MED ORDER — DOXYCYCLINE HYCLATE 100 MG PO CAPS: 100.0000 mg | ORAL_CAPSULE | Freq: Two times a day (BID) | ORAL | 0 refills | Status: DC

## 2016-11-21 MED ORDER — SALINE SPRAY 0.65 % NA SOLN: 1.0000 | NASAL | 0 refills | Status: AC | PRN

## 2016-11-21 NOTE — Discharge Instructions (Signed)
Read the information below.  Use the prescribed medication as directed.  Please discuss all new medications with your pharmacist.  You may return to the Emergency Department at any time for worsening condition or any new symptoms that concern you.    If you develop high fevers, severe facial pain, uncontrolled nosebleed, please return to the ER for a recheck.

## 2016-11-21 NOTE — ED Provider Notes (Signed)
[facility] DEPT Provider Note   CSN: [id number] Arrival date & time: 11/21/16  6222  By signing my name below, I, Ephriam Jenkins, attest that this documentation has been prepared under the direction and in the presence of [facility] PA-C.  Electronically Signed: Ephriam Jenkins, ED Scribe. 11/21/16. 5:55 PM.  History   Chief Complaint No chief complaint on file.  HPI HPI Comments: Tami Cruz is a 31 y.o. female who presents to the Emergency Department complaining of left sided sinus pressure and associated intermittent nose bleeds and postnasal drip which started six days ago. Pt reports a constant feeling of "pressure" to her left sinus. She reports an exacerbation of pain when she sneezes. Pt further notes that she has had intermittent nose bleeds every day since the onset of her symptoms, only on the left. No Hx of similar symptoms. She has tried using Flonase and Claritin with no relief of symptoms. Pt denies any ear pain, fever, sore throat, cough.  The history is provided by the patient. No language interpreter was used.    Past Medical History:  Diagnosis Date  . Asthma   . Migraines     Patient Active Problem List   Diagnosis Date Noted  . Headache(784.0) 01/26/2013    History reviewed. No pertinent surgical history.  OB History    No data available       Home Medications    Prior to Admission medications   Medication Sig Start Date End Date Taking? Authorizing Provider  clindamycin (CLEOCIN) 150 MG capsule Take 2 capsules (300 mg total) by mouth 3 (three) times daily. May dispense as 135m capsules 11/09/16   NNona Dell PA-C  doxycycline (VIBRAMYCIN) 100 MG capsule Take 1 capsule (100 mg total) by mouth 2 (two) times daily. One po bid x 7 days 11/21/16   [place] PA-C  ECHINACEA EXTRACT PO Take 2 drops by mouth daily. For immune support    [provider]  HYDROcodone-acetaminophen (NORCO/VICODIN) 5-325 MG tablet Take 1 tablet  by mouth every 4 (four) hours as needed. 11/09/16   NNona Dell PA-C  hydrOXYzine (ATARAX/VISTARIL) 25 MG tablet Take 1 tablet (25 mg total) by mouth every 8 (eight) hours as needed for itching. 10/01/16   WClayton Bibles PA-C  ibuprofen (ADVIL,MOTRIN) 200 MG tablet Take 400 mg by mouth every 6 (six) hours as needed for headache.    [provider]  loratadine (CLARITIN) 10 MG tablet Take 1 tablet (10 mg total) by mouth daily. 10/01/16   WClayton Bibles PA-C  Multiple Vitamin (MULTIVITAMIN WITH MINERALS) TABS tablet Take 1 tablet by mouth daily.    [provider]  naproxen (NAPROSYN) 500 MG tablet Take 1 tablet (500 mg total) by mouth 2 (two) times daily. 07/14/16   MShary Decamp PA-C  sodium chloride (OCEAN) 0.65 % SOLN nasal spray Place 1 spray into both nostrils as needed for congestion. 11/21/16   WClayton Bibles PA-C  Sodium Chloride-Sodium Bicarb (CLASSIC NETI POT SINUS WASH) 2300-700 MG KIT Use according to directions on box. 11/21/16   WClayton Bibles PA-C    Family History No family history on file.  Social History Social History  Substance Use Topics  . Smoking status: Never Smoker  . Smokeless tobacco: Never Used  . Alcohol use Yes     Comment: socially     Allergies   Gluten meal and Penicillins   Review of Systems Review of Systems  Constitutional: Negative for chills and fever.  HENT: Positive  for nosebleeds (intermittent, none currently), postnasal drip, sinus pressure (left) and sneezing. Negative for ear pain, sinus pain, sore throat and trouble swallowing.   Respiratory: Negative for cough and shortness of breath.   Allergic/Immunologic: Negative for immunocompromised state.   Physical Exam Updated Vital Signs BP 116/75 (BP Location: Left Arm)   Pulse 81   Temp 98.2 F (36.8 C) (Oral)   Resp 18   Wt 114.3 kg (252 lb)   LMP 11/16/2016   SpO2 100%   BMI 41.93 kg/m   Physical Exam  Constitutional: She appears well-developed and  well-nourished. No distress.  HENT:  Head: Normocephalic and atraumatic.  Nose: Mucosal edema present. Left sinus exhibits maxillary sinus tenderness.  Mouth/Throat: Mucous membranes are not dry. Posterior oropharyngeal erythema present. No oropharyngeal exudate, posterior oropharyngeal edema or tonsillar abscesses.  Right nasal mucosa pale, left nasal mucosa edematous and erythematous.    Eyes: Conjunctivae are normal.  Neck: Neck supple.  Cardiovascular: Normal rate and regular rhythm.   Pulmonary/Chest: Effort normal and breath sounds normal. No respiratory distress. She has no wheezes. She has no rales.  Neurological: She is alert.  Skin: She is not diaphoretic.  Nursing note and vitals reviewed.    ED Treatments / Results  DIAGNOSTIC STUDIES: Oxygen Saturation is 100% on RA, normal by my interpretation.  COORDINATION OF CARE: 5:58 PM-Discussed treatment plan with pt at bedside and pt agreed to plan.   Labs (all labs ordered are listed, but only abnormal results are displayed) Labs Reviewed - No data to display  EKG  EKG Interpretation None       Radiology No results found.  Procedures Procedures (including critical care time)  Medications Ordered in ED Medications - No data to display   Initial Impression / Assessment and Plan / ED Course  I have reviewed the triage vital signs and the nursing notes.  Pertinent labs & imaging results that were available during my care of the patient were reviewed by me and considered in my medical decision making (see chart for details).     Afebrile, nontoxic patient with isolated left maxillary sinusitis.  Will treat with antibiotics.   D/C home with antibiotics, symptomatic medications, resources for follow up.   Discussed result, findings, treatment, and follow up  with patient.  Pt given return precautions.  Pt verbalizes understanding and agrees with plan.       Final Clinical Impressions(s) / ED Diagnoses   Final  diagnoses:  Left maxillary sinusitis    New Prescriptions Discharge Medication List as of 11/21/2016  5:59 PM    START taking these medications   Details  doxycycline (VIBRAMYCIN) 100 MG capsule Take 1 capsule (100 mg total) by mouth 2 (two) times daily. One po bid x 7 days, Starting Sun 11/21/2016, Print    sodium chloride (OCEAN) 0.65 % SOLN nasal spray Place 1 spray into both nostrils as needed for congestion., Starting Sun 11/21/2016, Print    Sodium Chloride-Sodium Bicarb (CLASSIC NETI POT SINUS WASH) 2300-700 MG KIT Use according to directions on box., Print       I personally performed the services described in this documentation, which was scribed in my presence. The recorded information has been reviewed and is accurate.    Clayton Bibles, [place] 11/21/16 Langston Reusing    Duffy Bruce, MD 11/23/16 [phone]

## 2016-11-21 NOTE — ED Triage Notes (Signed)
Pt reports sinus headaches since Tuesday and reports having post nasal drip that increases at night.  Pt also reports some nose bleeding especially when she is a work at [employer].

## 2016-12-17 ENCOUNTER — Encounter (HOSPITAL_COMMUNITY): Payer: Self-pay | Admitting: Emergency Medicine

## 2016-12-17 ENCOUNTER — Emergency Department (HOSPITAL_COMMUNITY)
Admission: EM | Admit: 2016-12-17 | Discharge: 2016-12-17 | Disposition: A | Discharge status: Home / Self Care | Payer: Self-pay | Attending: Emergency Medicine | Admitting: Emergency Medicine

## 2016-12-17 DIAGNOSIS — R110 Nausea: Secondary | ICD-10-CM | POA: Insufficient documentation

## 2016-12-17 DIAGNOSIS — L299 Pruritus, unspecified: Secondary | ICD-10-CM | POA: Insufficient documentation

## 2016-12-17 DIAGNOSIS — Z79899 Other long term (current) drug therapy: Secondary | ICD-10-CM | POA: Insufficient documentation

## 2016-12-17 DIAGNOSIS — R51 Headache: Secondary | ICD-10-CM | POA: Insufficient documentation

## 2016-12-17 DIAGNOSIS — J45909 Unspecified asthma, uncomplicated: Secondary | ICD-10-CM | POA: Insufficient documentation

## 2016-12-17 MED ORDER — ONDANSETRON 4 MG PO TBDP: 4.0000 mg | ORAL_TABLET | Freq: Three times a day (TID) | ORAL | 0 refills | Status: AC | PRN

## 2016-12-17 NOTE — ED Notes (Signed)
Pt reports h/a with nausea x 3 days.  Have hx of migraine h/a but is not normally accompanied with nausea.  Pt is able to eat and drink without vomiting.  Pt is also requesting for a work note for when she called the ED on the 18th for medical advice, states she did not have a way to the ED and had to miss work.  She reports that her work is telling her that she needs a work note for when she missed work on the 18th.  Pt made aware that the ED has to physically see a patient to provide a work note.  Pt verbalized understanding.

## 2016-12-17 NOTE — ED Provider Notes (Signed)
[name] DEPT Provider Note   CSN: [id number] Arrival date & time: 12/17/16  1446   By signing my name below, I, Eunice Blase, attest that this documentation has been prepared under the direction and in the presence of Shawn Joy, PA-C. Electronically signed, Eunice Blase, ED Scribe. 12/17/16. 6:47 PM.  History   Chief Complaint Chief Complaint  Patient presents with  . Nausea  . Headache   The history is provided by the patient and medical records. No language interpreter was used.    Tami Cruz is a 31 y.o. female presenting to the Emergency Department Complaining of intermittent headache with nausea for the past 4 days. Patient states the headache is consistent with her previously diagnosed migraines and she has been able to control the headaches as she normally would with ibuprofen and/or Tylenol. There has been no change in the nature, location, or intensity of her headaches. She presents today because she has been having some mild nausea with her headaches. This has not happened before. She denies current headache or nausea. She denies fever, neck pain/stiffness, neuro deficits, syncope, dizziness/lightheadedness, sore throat, rash, vomiting/diarrhea, abdominal pain, or any other complaints. LNMP 11/17/2016. No sexual activity x 1.5 years. No suspicion of pregnancy. Pt followed by [facility] for her migraines, but hasn't been back "in a while."        Past Medical History:  Diagnosis Date  . Asthma   . Migraines     Patient Active Problem List   Diagnosis Date Noted  . Headache(784.0) 01/26/2013    History reviewed. No pertinent surgical history.  OB History    No data available       Home Medications    Prior to Admission medications   Medication Sig Start Date End Date Taking? Authorizing Provider  ECHINACEA EXTRACT PO Take 2 drops by mouth daily. For immune support   Yes [provider]  loratadine (CLARITIN)  10 MG tablet Take 1 tablet (10 mg total) by mouth daily. 10/01/16  Yes West, Emily, PA-C  Multiple Vitamin (MULTIVITAMIN WITH MINERALS) TABS tablet Take 1 tablet by mouth daily.   Yes [provider]  sodium chloride (OCEAN) 0.65 % SOLN nasal spray Place 1 spray into both nostrils as needed for congestion. 11/21/16  Yes West, Emily, PA-C  Sodium Chloride-Sodium Bicarb (CLASSIC NETI POT SINUS WASH) 2300-700 MG KIT Use according to directions on box. 11/21/16  Yes West, Emily, PA-C  clindamycin (CLEOCIN) 150 MG capsule Take 2 capsules (300 mg total) by mouth 3 (three) times daily. May dispense as 110m capsules Patient not taking: Reported on 12/17/2016 11/09/16   NNona Dell PA-C  doxycycline (VIBRAMYCIN) 100 MG capsule Take 1 capsule (100 mg total) by mouth 2 (two) times daily. One po bid x 7 days Patient not taking: Reported on 12/17/2016 11/21/16   WClayton Bibles PA-C  HYDROcodone-acetaminophen (NORCO/VICODIN) 5-325 MG tablet Take 1 tablet by mouth every 4 (four) hours as needed. Patient not taking: Reported on 12/17/2016 11/09/16   NNona Dell PA-C  hydrOXYzine (ATARAX/VISTARIL) 25 MG tablet Take 1 tablet (25 mg total) by mouth every 8 (eight) hours as needed for itching. Patient not taking: Reported on 12/17/2016 10/01/16   WClayton Bibles PA-C  naproxen (NAPROSYN) 500 MG tablet Take 1 tablet (500 mg total) by mouth 2 (two) times daily. Patient not taking: Reported on 12/17/2016 07/14/16   MShary Decamp PA-C  ondansetron (ZOFRAN ODT) 4 MG disintegrating tablet Take 1 tablet (4 mg total) by  mouth every 8 (eight) hours as needed for nausea or vomiting. 12/17/16   Joy, Helane Gunther, PA-C    Family History No family history on file.  Social History Social History  Substance Use Topics  . Smoking status: Never Smoker  . Smokeless tobacco: Never Used  . Alcohol use Yes     Comment: socially     Allergies   Gluten meal and Penicillins   Review of Systems Review of Systems    Constitutional: Negative for chills, diaphoresis and fever.  HENT: Negative for congestion, facial swelling, sinus pain and sinus pressure.   Respiratory: Negative for shortness of breath.   Cardiovascular: Negative for chest pain.  Gastrointestinal: Positive for nausea. Negative for abdominal distention, abdominal pain, diarrhea and vomiting.  Genitourinary: Negative for pelvic pain, vaginal bleeding and vaginal discharge.  Skin: Negative for wound.  Neurological: Positive for headaches. Negative for dizziness, syncope, weakness, light-headedness and numbness.  Psychiatric/Behavioral: Negative for confusion.  All other systems reviewed and are negative.    Physical Exam Updated Vital Signs BP (!) 142/88 (BP Location: Left Arm)   Pulse 77   Temp 98.2 F (36.8 C) (Oral)   Resp 16   Ht '5\' 5"'$  (1.651 m)   Wt 225 lb (102.1 kg)   LMP 11/17/2016   SpO2 100%   BMI 37.44 kg/m   Physical Exam  Constitutional: She appears well-developed and well-nourished. No distress.  Obese, black female  HENT:  Head: Normocephalic and atraumatic.  Mouth/Throat: Oropharynx is clear and moist.  Eyes: Pupils are equal, round, and reactive to light. Conjunctivae and EOM are normal.  Neck: Normal range of motion. Neck supple.  Cardiovascular: Normal rate, regular rhythm, normal heart sounds and intact distal pulses.   Pulmonary/Chest: Effort normal and breath sounds normal. No respiratory distress.  Abdominal: Soft. There is no tenderness. There is no guarding.  Musculoskeletal: She exhibits no edema.  Normal motor function intact in all extremities and spine. No midline spinal tenderness.   Lymphadenopathy:    She has no cervical adenopathy.  Neurological: She is alert.  No sensory deficits. Strength 5/5 in all extremities. No gait disturbance. Coordination intact including heel to shin and finger to nose. Cranial nerves III-XII grossly intact. No facial droop.  Skin: Skin is warm and dry. Capillary  refill takes less than 2 seconds. She is not diaphoretic.  Psychiatric: She has a normal mood and affect. Her behavior is normal.  Nursing note and vitals reviewed.    ED Treatments / Results  DIAGNOSTIC STUDIES: Oxygen Saturation is 100% on RA, NL by my interpretation.    COORDINATION OF CARE: 6:27 PM-Discussed next steps with pt. Pt verbalized understanding and is agreeable with the plan. Will Rx zofran. Pt prepared for d/c, advised of symptomatic care at home, F/U instructions and return precautions.    Labs (all labs ordered are listed, but only abnormal results are displayed) Labs Reviewed - No data to display  EKG  EKG Interpretation None       Radiology No results found.  Procedures Procedures (including critical care time)  Medications Ordered in ED Medications - No data to display   Initial Impression / Assessment and Plan / ED Course  I have reviewed the triage vital signs and the nursing notes.  Pertinent labs & imaging results that were available during my care of the patient were reviewed by me and considered in my medical decision making (see chart for details).     Patient presents with intermittent  headache with nausea. She has no neuro or functional deficits. She had no recurrence of her symptoms during her ED course. A pregnancy test was recommended, but patient refused. Recommend follow-up with her neurologist. The patient was given instructions for home care as well as return precautions. Patient voices understanding of these instructions, accepts the plan, and is comfortable with discharge.  Vitals:   12/17/16 1500 12/17/16 1508 12/17/16 1913  BP: (!) 142/88  138/79  Pulse: 77  68  Resp: 16  17  Temp: 98.2 F (36.8 C)    TempSrc: Oral    SpO2: 100%  100%  Weight:  102.1 kg (225 lb)   Height:  _0  (1.651 m)      Final Clinical Impressions(s) / ED Diagnoses   Final diagnoses:  Nausea    New Prescriptions Discharge Medication List as  of 12/17/2016  6:44 PM    START taking these medications   Details  ondansetron (ZOFRAN ODT) 4 MG disintegrating tablet Take 1 tablet (4 mg total) by mouth every 8 (eight) hours as needed for nausea or vomiting., Starting Fri 12/17/2016, Print      I personally performed the services described in this documentation, which was scribed in my presence. The recorded information has been reviewed and is accurate.   Lorayne Bender, PA-C 12/19/16 [id number]    Varney Biles, MD 12/19/16 [phone]

## 2016-12-17 NOTE — ED Triage Notes (Addendum)
Pt states she called here on the 18th with complaints of nausea, headache, and skin irritation. Reports the headache has eased off but still has some nausea and some itching on her right inner arm.  Pt states she needs a note to where she can return to work from not being there on the 18th. Denies vomiting or diarrhea.

## 2016-12-17 NOTE — Discharge Instructions (Signed)
Use the Zofran as needed for nausea. Follow up with your neurologist as soon as possible.

## 2017-02-10 ENCOUNTER — Encounter (HOSPITAL_COMMUNITY): Payer: Self-pay | Admitting: Emergency Medicine

## 2017-02-10 ENCOUNTER — Emergency Department (HOSPITAL_COMMUNITY)
Admission: EM | Admit: 2017-02-10 | Discharge: 2017-02-10 | Disposition: A | Discharge status: Home / Self Care | Payer: Self-pay | Attending: Emergency Medicine | Admitting: Emergency Medicine

## 2017-02-10 DIAGNOSIS — Z79899 Other long term (current) drug therapy: Secondary | ICD-10-CM | POA: Insufficient documentation

## 2017-02-10 DIAGNOSIS — R0981 Nasal congestion: Secondary | ICD-10-CM | POA: Insufficient documentation

## 2017-02-10 DIAGNOSIS — H9202 Otalgia, left ear: Secondary | ICD-10-CM | POA: Insufficient documentation

## 2017-02-10 DIAGNOSIS — J45909 Unspecified asthma, uncomplicated: Secondary | ICD-10-CM | POA: Insufficient documentation

## 2017-02-10 MED ORDER — PSEUDOEPHEDRINE HCL 30 MG PO TABS: 30.0000 mg | ORAL_TABLET | Freq: Four times a day (QID) | ORAL | 0 refills | Status: AC | PRN

## 2017-02-10 NOTE — ED Triage Notes (Signed)
Patient c/o left ear pain and ringing that has been constant since Sunday.

## 2017-02-10 NOTE — ED Provider Notes (Signed)
[facility] DEPT Provider Note   CSN: [id number] Arrival date & time: 02/10/17  1611     History   Chief Complaint Chief Complaint  Patient presents with  . Otalgia    HPI Tami Cruz is a 31 y.o. female who presents to the ED with left ear pain. The pain started a couple days ago as a full feeling. The patient does report nasal congestion. She also reports an occasional ringing in the left ear. Patient has taken no medications. Patient denies use of ASA.  The history is provided by the patient. No language interpreter was used.  Otalgia  This is a new problem. The current episode started 2 days ago. There is pain in the left ear. The problem occurs constantly. The problem has not changed since onset.There has been no fever. Pertinent negatives include no ear discharge, no headaches, no hearing loss and no vomiting.    Past Medical History:  Diagnosis Date  . Asthma   . Migraines     Patient Active Problem List   Diagnosis Date Noted  . Headache(784.0) 01/26/2013    History reviewed. No pertinent surgical history.  OB History    No data available       Home Medications    Prior to Admission medications   Medication Sig Start Date End Date Taking? Authorizing Provider  ECHINACEA EXTRACT PO Take 2 drops by mouth daily. For immune support    [provider]  hydrOXYzine (ATARAX/VISTARIL) 25 MG tablet Take 1 tablet (25 mg total) by mouth every 8 (eight) hours as needed for itching. Patient not taking: Reported on 12/17/2016 10/01/16   Clayton Bibles, PA-C  loratadine (CLARITIN) 10 MG tablet Take 1 tablet (10 mg total) by mouth daily. 10/01/16   Clayton Bibles, PA-C  Multiple Vitamin (MULTIVITAMIN WITH MINERALS) TABS tablet Take 1 tablet by mouth daily.    [provider]  ondansetron (ZOFRAN ODT) 4 MG disintegrating tablet Take 1 tablet (4 mg total) by mouth every 8 (eight) hours as needed for nausea or vomiting. 12/17/16   Joy, Shawn C, PA-C    pseudoephedrine (SUDAFED) 30 MG tablet Take 1 tablet (30 mg total) by mouth every 6 (six) hours as needed for congestion. 02/10/17   Ashley Murrain, NP  sodium chloride (OCEAN) 0.65 % SOLN nasal spray Place 1 spray into both nostrils as needed for congestion. 11/21/16   Clayton Bibles, PA-C  Sodium Chloride-Sodium Bicarb (CLASSIC NETI POT SINUS WASH) 2300-700 MG KIT Use according to directions on box. 11/21/16   Clayton Bibles, PA-C    Family History No family history on file.  Social History Social History  Substance Use Topics  . Smoking status: Never Smoker  . Smokeless tobacco: Never Used  . Alcohol use Yes     Comment: socially     Allergies   Gluten meal and Penicillins   Review of Systems Review of Systems  Constitutional: Negative for chills and fever.  HENT: Positive for congestion, ear pain and tinnitus. Negative for ear discharge and hearing loss.   Eyes: Negative for discharge and itching.  Gastrointestinal: Negative for nausea and vomiting.  Neurological: Negative for headaches.     Physical Exam Updated Vital Signs BP 136/90 (BP Location: Left Arm)   Pulse 65   Temp 98.3 F (36.8 C) (Oral)   Resp 18   LMP 02/03/2017   SpO2 100%   Physical Exam  Constitutional: She is oriented to person, place, and time. She appears well-developed and  well-nourished. No distress.  HENT:  Head: Normocephalic.  Right Ear: Tympanic membrane normal.  Left Ear: Tympanic membrane normal. No mastoid tenderness.  Nose: Rhinorrhea present.  Mouth/Throat: Uvula is midline, oropharynx is clear and moist and mucous membranes are normal.  Eyes: EOM are normal.  Neck: Neck supple.  Cardiovascular: Normal rate and regular rhythm.   Pulmonary/Chest: Effort normal and breath sounds normal.  Abdominal: Soft. There is no tenderness.  Musculoskeletal: Normal range of motion.  Neurological: She is alert and oriented to person, place, and time. No cranial nerve deficit.  Skin: Skin is warm and  dry.  Psychiatric: She has a normal mood and affect. Her behavior is normal.  Nursing note and vitals reviewed.    ED Treatments / Results  Labs (all labs ordered are listed, but only abnormal results are displayed) Labs Reviewed - No data to display  Radiology No results found.  Procedures Procedures (including critical care time)  Medications Ordered in ED Medications - No data to display   Initial Impression / Assessment and Plan / ED Course  I have reviewed the triage vital signs and the nursing notes.  31 y.o. female with left ear fullness and pain and occasional ringing stable for d/c without fever, mastoid tenderness or other problems. Will treat with decongestants and if symptoms persist patient to f/u with ENT. Rx Sudafed.   Final Clinical Impressions(s) / ED Diagnoses   Final diagnoses:  Left ear pain  Nasal congestion    New Prescriptions New Prescriptions   PSEUDOEPHEDRINE (SUDAFED) 30 MG TABLET    Take 1 tablet (30 mg total) by mouth every 6 (six) hours as needed for congestion.     Debroah Baller McFarlan, [place] 02/10/17 Erskin Burnet    Dorie Rank, MD 02/12/17 [phone]

## 2017-02-10 NOTE — Discharge Instructions (Signed)
Take the medication as directed. If the symptoms persist follow up with Dr. Pollyann Kennedyosen.

## 2017-02-10 NOTE — ED Notes (Signed)
Bed: [facility]ALA Expected date:  Expected time:  Means of arrival:  Comments: 

## 2017-05-26 ENCOUNTER — Other Ambulatory Visit: Payer: Self-pay

## 2017-05-26 ENCOUNTER — Encounter (HOSPITAL_COMMUNITY): Payer: Self-pay | Admitting: Nurse Practitioner

## 2017-05-26 ENCOUNTER — Emergency Department (HOSPITAL_COMMUNITY)
Admission: EM | Admit: 2017-05-26 | Discharge: 2017-05-26 | Disposition: A | Discharge status: Home / Self Care | Payer: Self-pay | Attending: Emergency Medicine | Admitting: Emergency Medicine

## 2017-05-26 DIAGNOSIS — G43009 Migraine without aura, not intractable, without status migrainosus: Secondary | ICD-10-CM | POA: Insufficient documentation

## 2017-05-26 MED ORDER — SODIUM CHLORIDE 0.9 % IV BOLUS (SEPSIS)
1000.0000 mL | Freq: Once | INTRAVENOUS | Status: AC
  Administered 2017-05-26: 1000 mL via INTRAVENOUS

## 2017-05-26 MED ORDER — PROCHLORPERAZINE EDISYLATE 5 MG/ML IJ SOLN
10.0000 mg | Freq: Once | INTRAMUSCULAR | Status: AC
  Administered 2017-05-26: 10 mg via INTRAVENOUS
  Filled 2017-05-26: qty 2

## 2017-05-26 MED ORDER — DIPHENHYDRAMINE HCL 50 MG/ML IJ SOLN
25.0000 mg | Freq: Once | INTRAMUSCULAR | Status: AC
  Administered 2017-05-26: 25 mg via INTRAVENOUS
  Filled 2017-05-26: qty 1

## 2017-05-26 MED ORDER — DEXAMETHASONE SODIUM PHOSPHATE 10 MG/ML IJ SOLN
10.0000 mg | Freq: Once | INTRAMUSCULAR | Status: AC
  Administered 2017-05-26: 10 mg via INTRAVENOUS
  Filled 2017-05-26: qty 1

## 2017-05-26 MED ORDER — PROCHLORPERAZINE MALEATE 10 MG PO TABS: 10.0000 mg | ORAL_TABLET | Freq: Two times a day (BID) | ORAL | 0 refills | Status: AC | PRN

## 2017-05-26 NOTE — ED Notes (Signed)
Bed: WA04 Expected date:  Expected time:  Means of arrival:  Comments: 

## 2017-05-26 NOTE — ED Triage Notes (Signed)
Patient presenting with a migraine that she has had off and on since christmas day. Patient has tried OTC Excedrin with some relief. Patient states the pain is in her forehead and claims she was a little dizzy once this morning

## 2017-05-26 NOTE — ED Notes (Signed)
Bed: WLPT2 Expected date:  Expected time:  Means of arrival:  Comments: 

## 2017-05-26 NOTE — Discharge Instructions (Signed)
Your exam today was reassuring and we suspect you have a recurrent migraine headache.  Please use the medication to help with your symptoms.  Please stay hydrated and get rest.   please follow-up with your primary care physician in several days for further management and reassessment.  If any symptoms change or worsen, please return to the nearest emergency department.

## 2017-05-26 NOTE — ED Provider Notes (Signed)
[facility] DEPT Provider Note   CSN: [id number] Arrival date & time: 05/26/17  2409     History   Chief Complaint No chief complaint on file. Headache  HPI Tami Cruz is a 31 y.o. female.  The history is provided by the patient and medical records. No language interpreter was used.  Migraine  This is a recurrent problem. The current episode started 2 days ago. The problem occurs constantly. The problem has not changed since onset.Associated symptoms include headaches. Pertinent negatives include no chest pain, no abdominal pain and no shortness of breath. Exacerbated by: bright lights, loud noises. Nothing relieves the symptoms. She has tried nothing for the symptoms.    Past Medical History:  Diagnosis Date  . Asthma   . Migraines     Patient Active Problem List   Diagnosis Date Noted  . Headache(784.0) 01/26/2013    History reviewed. No pertinent surgical history.  OB History    No data available       Home Medications    Prior to Admission medications   Medication Sig Start Date End Date Taking? Authorizing Provider  ECHINACEA EXTRACT PO Take 2 drops by mouth daily. For immune support    [provider]  hydrOXYzine (ATARAX/VISTARIL) 25 MG tablet Take 1 tablet (25 mg total) by mouth every 8 (eight) hours as needed for itching. Patient not taking: Reported on 12/17/2016 10/01/16   Clayton Bibles, PA-C  loratadine (CLARITIN) 10 MG tablet Take 1 tablet (10 mg total) by mouth daily. 10/01/16   Clayton Bibles, PA-C  Multiple Vitamin (MULTIVITAMIN WITH MINERALS) TABS tablet Take 1 tablet by mouth daily.    [provider]  ondansetron (ZOFRAN ODT) 4 MG disintegrating tablet Take 1 tablet (4 mg total) by mouth every 8 (eight) hours as needed for nausea or vomiting. 12/17/16   Joy, Shawn C, PA-C  pseudoephedrine (SUDAFED) 30 MG tablet Take 1 tablet (30 mg total) by mouth every 6 (six) hours as needed for congestion.  02/10/17   Ashley Murrain, NP  sodium chloride (OCEAN) 0.65 % SOLN nasal spray Place 1 spray into both nostrils as needed for congestion. 11/21/16   Clayton Bibles, PA-C  Sodium Chloride-Sodium Bicarb (CLASSIC NETI POT SINUS WASH) 2300-700 MG KIT Use according to directions on box. 11/21/16   Clayton Bibles, PA-C    Family History History reviewed. No pertinent family history.  Social History Social History   Tobacco Use  . Smoking status: Never Smoker  . Smokeless tobacco: Never Used  Substance Use Topics  . Alcohol use: Yes    Comment: socially  . Drug use: No     Allergies   Gluten meal and Penicillins   Review of Systems Review of Systems  Constitutional: Negative for chills, diaphoresis, fatigue and fever.  HENT: Negative for congestion.   Eyes: Positive for photophobia. Negative for pain and visual disturbance.  Respiratory: Negative for cough, chest tightness, shortness of breath and wheezing.   Cardiovascular: Negative for chest pain.  Gastrointestinal: Negative for abdominal pain, constipation, diarrhea, nausea and vomiting.  Genitourinary: Negative for dysuria and enuresis.  Musculoskeletal: Negative for back pain, neck pain and neck stiffness.  Neurological: Positive for light-headedness (resolved) and headaches. Negative for weakness and numbness.  All other systems reviewed and are negative.    Physical Exam Updated Vital Signs BP 136/80   Pulse 70   Temp 98.8 F (37.1 C) (Oral)   Resp 18   Ht _0  (1.676 m)  Wt 108.9 kg (240 lb)   SpO2 100%   BMI 38.74 kg/m   Physical Exam  Constitutional: She is oriented to person, place, and time. She appears well-developed and well-nourished. No distress.  HENT:  Head: Normocephalic.  Mouth/Throat: Oropharynx is clear and moist. No oropharyngeal exudate.  Eyes: Conjunctivae and EOM are normal. Pupils are equal, round, and reactive to light.  Neck: Normal range of motion.  Cardiovascular: Normal rate.  No murmur  heard. Pulmonary/Chest: Effort normal and breath sounds normal. No respiratory distress. She has no wheezes. She exhibits no tenderness.  Abdominal: Soft. Bowel sounds are normal. She exhibits no distension. There is no tenderness.  Musculoskeletal: She exhibits no edema or tenderness.  Neurological: She is alert and oriented to person, place, and time. She is not disoriented. No cranial nerve deficit or sensory deficit. She exhibits normal muscle tone. GCS eye subscore is 4. GCS verbal subscore is 5. GCS motor subscore is 6.  Skin: Capillary refill takes less than 2 seconds. No rash noted. She is not diaphoretic. No erythema.  Psychiatric: She has a normal mood and affect.  Nursing note and vitals reviewed.    ED Treatments / Results  Labs (all labs ordered are listed, but only abnormal results are displayed) Labs Reviewed - No data to display  EKG  EKG Interpretation None       Radiology No results found.  Procedures Procedures (including critical care time)  Medications Ordered in ED Medications  sodium chloride 0.9 % bolus 1,000 mL (1,000 mLs Intravenous New Bag/Given 05/26/17 1115)  prochlorperazine (COMPAZINE) injection 10 mg (10 mg Intravenous Given 05/26/17 1116)  dexamethasone (DECADRON) injection 10 mg (10 mg Intravenous Given 05/26/17 1115)  diphenhydrAMINE (BENADRYL) injection 25 mg (25 mg Intravenous Given 05/26/17 1116)     Initial Impression / Assessment and Plan / ED Course  I have reviewed the triage vital signs and the nursing notes.  Pertinent labs & imaging results that were available during my care of the patient were reviewed by me and considered in my medical decision making (see chart for details).     Tami Cruz is a 31 y.o. female with a past medical history significant for asthma and migraines who presents patient reports that for the last 2 days, she has had severe headaches.  She reports that it is similar to prior migraines with  associated photophobia and phonophobia.  She denies fevers, chills, neck pain, neck stiffness.  She denies any vision changes, nausea, vomiting, conservation, diarrhea, dysuria.  She denies any recent traumatic injuries.  She reports that she has tried Excedrin at home with significant relief of symptoms.  She describes her headache as an 8 out of 10 in severity and frontal with aching and throbbing pain.  She denies any other complaints on arrival.  On exam, no focal neurologic deficits.  Normal extraocular movements and normal pupil exam.  Sensation intact throughout.  Normal strength in all extremities.  Neck is nontender with full range of motion.  Lungs clear and chest nontender.  Suspect recurrent migraine.  Patient will be given headache cocktail to try to alleviate symptoms.  Anticipate reassessment after medications to determine if further management is needed.  2:05 PM Patient reassessed her headache has significantly improved.  Patient is requesting discharge home.  Given reassuring exam, I suspect this was a typical migraine.  Patient was given prescription for Compazine at discharge as this is helped her.  Patient will follow up with her PCP for  further headache management.  Patient understood return precautions for any new or worsening symptoms.  Patient discharged in good condition.    Final Clinical Impressions(s) / ED Diagnoses   Final diagnoses:  Migraine without aura and without status migrainosus, not intractable    ED Discharge Orders        Ordered    prochlorperazine (COMPAZINE) 10 MG tablet  2 times daily PRN     05/26/17 1411      Clinical Impression: 1. Migraine without aura and without status migrainosus, not intractable     Disposition: Discharge  Condition: Good  I have discussed the results, Dx and Tx plan with the pt(& family if present). He/she/they expressed understanding and agree(s) with the plan. Discharge instructions discussed at great  length. Strict return precautions discussed and pt &/or family have verbalized understanding of the instructions. No further questions at time of discharge.    This SmartLink is deprecated. Use AVSMEDLIST instead to display the medication list for a patient.  Follow Up: [place] [phone] Schedule an appointment as soon as possible for a visit    [facility] [place] [phone] mc [address] [place]       Jettie Mannor, Gwenyth Allegra, MD 05/26/17 [phone]

## 2017-05-30 ENCOUNTER — Emergency Department (HOSPITAL_COMMUNITY): Payer: Self-pay

## 2017-05-30 ENCOUNTER — Encounter (HOSPITAL_COMMUNITY): Payer: Self-pay

## 2017-05-30 ENCOUNTER — Emergency Department (HOSPITAL_COMMUNITY)
Admission: EM | Admit: 2017-05-30 | Discharge: 2017-05-30 | Disposition: A | Discharge status: Home / Self Care | Payer: Self-pay | Attending: Emergency Medicine | Admitting: Emergency Medicine

## 2017-05-30 ENCOUNTER — Other Ambulatory Visit: Payer: Self-pay

## 2017-05-30 DIAGNOSIS — Z79899 Other long term (current) drug therapy: Secondary | ICD-10-CM | POA: Insufficient documentation

## 2017-05-30 DIAGNOSIS — J209 Acute bronchitis, unspecified: Secondary | ICD-10-CM | POA: Insufficient documentation

## 2017-05-30 DIAGNOSIS — Z7982 Long term (current) use of aspirin: Secondary | ICD-10-CM | POA: Insufficient documentation

## 2017-05-30 DIAGNOSIS — J40 Bronchitis, not specified as acute or chronic: Secondary | ICD-10-CM

## 2017-05-30 DIAGNOSIS — J45909 Unspecified asthma, uncomplicated: Secondary | ICD-10-CM | POA: Insufficient documentation

## 2017-05-30 LAB — POC URINE PREG, ED: PREG TEST UR: NEGATIVE

## 2017-05-30 MED ORDER — DEXTROMETHORPHAN-GUAIFENESIN 10-100 MG/5ML PO SYRP: 5.0000 mL | ORAL_SOLUTION | Freq: Two times a day (BID) | ORAL | 0 refills | Status: AC

## 2017-05-30 MED ORDER — PREDNISONE 50 MG PO TABS: ORAL_TABLET | ORAL | 0 refills | Status: AC

## 2017-05-30 NOTE — ED Provider Notes (Signed)
[facility] DEPT Provider Note   CSN: [id number] Arrival date & time: 05/30/17  1062     History   Chief Complaint Chief Complaint  Patient presents with  . Nasal Congestion  . Cough  . Sore Throat    HPI Tami Cruz is a 31 y.o. female who presents to the ED with cough, sore throat and nasal congestion x 4 days. Patient taking OTC medication without relief. Hx of asthma.   HPI  Past Medical History:  Diagnosis Date  . Asthma   . Migraines     Patient Active Problem List   Diagnosis Date Noted  . Headache(784.0) 01/26/2013    History reviewed. No pertinent surgical history.  OB History    No data available       Home Medications    Prior to Admission medications   Medication Sig Start Date End Date Taking? Authorizing Provider  albuterol (VENTOLIN HFA) 108 (90 Base) MCG/ACT inhaler Inhale 2 puffs into the lungs every 6 (six) hours as needed for wheezing or shortness of breath.    [provider]  aspirin-acetaminophen-caffeine (EXCEDRIN MIGRAINE) [phone] MG tablet Take 2 tablets by mouth at bedtime as needed for headache or migraine.    [provider]  Dextromethorphan-Guaifenesin 10-100 MG/5ML liquid Take 5 mLs by mouth every 12 (twelve) hours. 05/30/17   Ashley Murrain, NP  ECHINACEA EXTRACT PO Take 2 drops by mouth daily. For immune support    [provider]  hydrOXYzine (ATARAX/VISTARIL) 25 MG tablet Take 1 tablet (25 mg total) by mouth every 8 (eight) hours as needed for itching. Patient not taking: Reported on 12/17/2016 10/01/16   Clayton Bibles, PA-C  loratadine (CLARITIN) 10 MG tablet Take 1 tablet (10 mg total) by mouth daily. 10/01/16   Clayton Bibles, PA-C  Multiple Vitamin (MULTIVITAMIN WITH MINERALS) TABS tablet Take 1 tablet by mouth daily.    [provider]  ondansetron (ZOFRAN ODT) 4 MG disintegrating tablet Take 1 tablet (4 mg total) by mouth every 8 (eight) hours as needed  for nausea or vomiting. Patient not taking: Reported on 05/26/2017 12/17/16   Lorayne Bender, PA-C  predniSONE (DELTASONE) 50 MG tablet Take one tablet PO daily 05/30/17   Ashley Murrain, NP  prochlorperazine (COMPAZINE) 10 MG tablet Take 1 tablet (10 mg total) by mouth 2 (two) times daily as needed for nausea or vomiting. 05/26/17   Tegeler, Gwenyth Allegra, MD  pseudoephedrine (SUDAFED) 30 MG tablet Take 1 tablet (30 mg total) by mouth every 6 (six) hours as needed for congestion. 02/10/17   Ashley Murrain, NP  sodium chloride (OCEAN) 0.65 % SOLN nasal spray Place 1 spray into both nostrils as needed for congestion. 11/21/16   Clayton Bibles, PA-C  Sodium Chloride-Sodium Bicarb (CLASSIC NETI POT SINUS WASH) 2300-700 MG KIT Use according to directions on box. 11/21/16   Clayton Bibles, PA-C    Family History History reviewed. No pertinent family history.  Social History Social History   Tobacco Use  . Smoking status: Never Smoker  . Smokeless tobacco: Never Used  Substance Use Topics  . Alcohol use: Yes    Comment: socially  . Drug use: No     Allergies   Gluten meal and Penicillins   Review of Systems Review of Systems  Constitutional: Positive for chills. Negative for fever.  HENT: Positive for congestion and sore throat. Negative for ear pain, nosebleeds and trouble swallowing.   Eyes: Negative for discharge, redness  and itching.  Respiratory: Positive for cough. Negative for shortness of breath. Wheezing: occasional.   Cardiovascular: Negative for chest pain.  Gastrointestinal: Negative for abdominal pain, nausea and vomiting.  Genitourinary: Negative for difficulty urinating and dysuria.  Musculoskeletal: Positive for myalgias.  Skin: Negative for rash.  Neurological: Negative for syncope and headaches.  Hematological: Negative for adenopathy.  Psychiatric/Behavioral: Negative for confusion.     Physical Exam Updated Vital Signs BP 133/87 (BP Location: Left Arm)   Pulse 77    Temp 98.6 F (37 C) (Oral)   Resp 16   Ht 5' 6"  (1.676 m)   Wt 113.4 kg (250 lb)   LMP 05/09/2017   SpO2 99%   BMI 40.35 kg/m   Physical Exam  Constitutional: She is oriented to person, place, and time. She appears well-developed and well-nourished. No distress.  HENT:  Head: Normocephalic and atraumatic.  Right Ear: Tympanic membrane normal.  Left Ear: Tympanic membrane normal.  Nose: Mucosal edema and rhinorrhea present.  Mouth/Throat: Uvula is midline and oropharynx is clear and moist.  Eyes: Conjunctivae and EOM are normal. Pupils are equal, round, and reactive to light.  Neck: Normal range of motion. Neck supple.  Cardiovascular: Normal rate and regular rhythm.  Pulmonary/Chest: Effort normal. No respiratory distress. Wheezes: occasional. She has no rales.  Musculoskeletal: Normal range of motion.  Lymphadenopathy:    She has no cervical adenopathy.  Neurological: She is alert and oriented to person, place, and time. No cranial nerve deficit.  Skin: Skin is warm and dry.  Psychiatric: She has a normal mood and affect. Her behavior is normal.  Nursing note and vitals reviewed.    ED Treatments / Results  Labs (all labs ordered are listed, but only abnormal results are displayed) Labs Reviewed  POC URINE PREG, ED   Radiology Dg Chest 2 View  Result Date: 05/30/2017 CLINICAL DATA:  Nonproductive cough for 4 days.  History of asthma. EXAM: CHEST  2 VIEW COMPARISON:  08/14/2015 FINDINGS: The cardiomediastinal silhouette is within normal limits. The lungs are well inflated and clear. There is no evidence of pleural effusion or pneumothorax. No acute osseous abnormality is identified. IMPRESSION: No active cardiopulmonary disease. Electronically Signed   By: Logan Bores M.D.   On: 05/30/2017 11:41    Procedures Procedures (including critical care time)  Medications Ordered in ED Medications - No data to display   Initial Impression / Assessment and Plan / ED Course    I have reviewed the triage vital signs and the nursing notes. Pt CXR negative for acute infiltrate. Hx of asthma. Patients symptoms are consistent with bronchitis, likely viral etiology. Discussed that antibiotics are not indicated for viral infections. Pt will be discharged with symptomatic treatment.  Verbalizes understanding and is agreeable with plan. Pt is hemodynamically stable & in NAD prior to dc. Patient to use inhaler as needed, Rx prednisone and cough mediation. Return precautions discussed.  Final Clinical Impressions(s) / ED Diagnoses   Final diagnoses:  Bronchitis    ED Discharge Orders        Ordered    Dextromethorphan-Guaifenesin 10-100 MG/5ML liquid  Every 12 hours     05/30/17 1240    predniSONE (DELTASONE) 50 MG tablet     05/30/17 [address] [place], NP 05/30/17 1248    Daleen Bo, MD 05/30/17 [phone]

## 2017-05-30 NOTE — Discharge Instructions (Signed)
Use your albuterol inhaler as needed. Return for worsening symptoms.

## 2017-05-30 NOTE — ED Triage Notes (Signed)
Patient c/o dy hacking cough, sore throat and nasal congestion x 4 days. Patient states she has been taking OTC Mucinex.

## 2017-05-30 NOTE — ED Notes (Signed)
Bed: WTR8 Expected date:  Expected time:  Means of arrival:  Comments: 

## 2017-06-06 ENCOUNTER — Other Ambulatory Visit: Payer: Self-pay

## 2017-06-06 ENCOUNTER — Encounter (HOSPITAL_COMMUNITY): Payer: Self-pay

## 2017-06-06 ENCOUNTER — Emergency Department (HOSPITAL_COMMUNITY)
Admission: EM | Admit: 2017-06-06 | Discharge: 2017-06-06 | Disposition: A | Discharge status: Home / Self Care | Payer: Self-pay | Attending: Emergency Medicine | Admitting: Emergency Medicine

## 2017-06-06 DIAGNOSIS — J029 Acute pharyngitis, unspecified: Secondary | ICD-10-CM | POA: Insufficient documentation

## 2017-06-06 DIAGNOSIS — Z5321 Procedure and treatment not carried out due to patient leaving prior to being seen by health care provider: Secondary | ICD-10-CM | POA: Insufficient documentation

## 2017-06-06 NOTE — ED Provider Notes (Cosign Needed)
Blood pressure 133/79, pulse 60, temperature 98.6 F (37 C), temperature source Oral, height 5\' 6"  (1.676 m), weight 113.4 kg (250 lb), last menstrual period 05/09/2017, SpO2 95 %.  Tami Cruz is a 32 y.o. female complaining of sore throat and nasal congestion.  This patient left without being seen after triage.  I did not participate in the care of this patient.    Wynetta Emeryisciotta, Inocencio Roy, Cordelia Poche-C 06/06/17 1138

## 2017-06-06 NOTE — ED Notes (Signed)
Upon calling pt for triage there was no answer

## 2017-06-06 NOTE — ED Triage Notes (Signed)
Patient c/o sore throat, dry cough, and nasal congestion. Patient states she was seen last week and was given prednisone, but feels the symptoms have worsen.
